# Patient Record
Sex: Male | Born: 1985 | Race: White | Hispanic: No | Marital: Single
Health system: Midwestern US, Community
[De-identification: ages and names within clinical notes are randomized; demographics above are authoritative.]

## PROBLEM LIST (undated history)

## (undated) ENCOUNTER — Emergency Department (HOSPITAL_COMMUNITY): Admission: EM | Discharge status: Absent without leave | Payer: Self-pay

---
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
## 2007-02-07 ENCOUNTER — Emergency Department: Payer: Self-pay | Admitting: Emergency Medicine

## 2007-10-20 ENCOUNTER — Emergency Department: Payer: Self-pay | Admitting: Emergency Medicine

## 2008-04-09 ENCOUNTER — Emergency Department: Payer: Self-pay | Admitting: Emergency Medicine

## 2008-06-09 ENCOUNTER — Emergency Department: Payer: Self-pay | Admitting: Emergency Medicine

## 2014-05-09 ENCOUNTER — Emergency Department
Admit: 2014-05-09 | Disposition: A | Discharge status: Home / Self Care | Payer: Self-pay | Admitting: Emergency Medicine

## 2014-10-12 ENCOUNTER — Encounter: Payer: Self-pay | Admitting: Emergency Medicine

## 2014-10-12 ENCOUNTER — Emergency Department: Payer: Managed Care, Other (non HMO)

## 2014-10-12 ENCOUNTER — Emergency Department
Admission: EM | Admit: 2014-10-12 | Discharge: 2014-10-12 | Disposition: A | Discharge status: Home / Self Care | Payer: Managed Care, Other (non HMO) | Attending: Emergency Medicine | Admitting: Emergency Medicine

## 2014-10-12 DIAGNOSIS — W1842XA Slipping, tripping and stumbling without falling due to stepping into hole or opening, initial encounter: Secondary | ICD-10-CM | POA: Diagnosis not present

## 2014-10-12 DIAGNOSIS — M545 Low back pain, unspecified: Secondary | ICD-10-CM

## 2014-10-12 DIAGNOSIS — S39012A Strain of muscle, fascia and tendon of lower back, initial encounter: Secondary | ICD-10-CM | POA: Diagnosis not present

## 2014-10-12 DIAGNOSIS — S3992XA Unspecified injury of lower back, initial encounter: Secondary | ICD-10-CM | POA: Diagnosis present

## 2014-10-12 DIAGNOSIS — Z720 Tobacco use: Secondary | ICD-10-CM | POA: Diagnosis not present

## 2014-10-12 DIAGNOSIS — Y9289 Other specified places as the place of occurrence of the external cause: Secondary | ICD-10-CM | POA: Insufficient documentation

## 2014-10-12 DIAGNOSIS — Y9389 Activity, other specified: Secondary | ICD-10-CM | POA: Diagnosis not present

## 2014-10-12 DIAGNOSIS — Y998 Other external cause status: Secondary | ICD-10-CM | POA: Diagnosis not present

## 2014-10-12 MED ORDER — ACETAMINOPHEN-CODEINE #3 300-30 MG PO TABS
2.0000 | ORAL_TABLET | Freq: Once | ORAL | Status: AC
  Administered 2014-10-12: 2 via ORAL
  Filled 2014-10-12: qty 2

## 2014-10-12 MED ORDER — ACETAMINOPHEN-CODEINE #3 300-30 MG PO TABS: 1.0000 | ORAL_TABLET | ORAL | Status: DC | PRN

## 2014-10-12 MED ORDER — CYCLOBENZAPRINE HCL 10 MG PO TABS: 10.0000 mg | ORAL_TABLET | Freq: Three times a day (TID) | ORAL | Status: DC | PRN

## 2014-10-12 MED ORDER — CYCLOBENZAPRINE HCL 10 MG PO TABS
10.0000 mg | ORAL_TABLET | Freq: Once | ORAL | Status: AC
  Administered 2014-10-12: 10 mg via ORAL
  Filled 2014-10-12: qty 1

## 2014-10-12 NOTE — ED Provider Notes (Signed)
[facility] Emergency Department Provider Note ____________________________________________  Time seen: Approximately 4:07 PM  I have reviewed the triage vital signs and the nursing notes.   HISTORY  Chief Complaint Back Pain   HPI [name] is a 29 y.o. male resents to the emergency department for evaluation of lower back pain. He states 2 days ago he stepped down into a pothole and sort of jarred his back. Since that time he has had pain in the lower back with radiation and to the right hip. He's had no relief with Goody's powder or Aleve. His job requires heavy lifting and bending. This makes the pain much worse.   History reviewed. No pertinent past medical history.  There are no active problems to display for this patient.   History reviewed. No pertinent past surgical history.  Current Outpatient Rx  Name  Route  Sig  Dispense  Refill  . acetaminophen-codeine (TYLENOL #3) 300-30 MG tablet   Oral   Take 1-2 tablets by mouth every 4 (four) hours as needed for moderate pain.   12 tablet   0   . cyclobenzaprine (FLEXERIL) 10 MG tablet   Oral   Take 1 tablet (10 mg total) by mouth 3 (three) times daily as needed for muscle spasms.   30 tablet   0     Allergies Review of patient's allergies indicates not on file.  No family history on file.  Social History Social History  Substance Use Topics  . Smoking status: Current Every Day Smoker  . Smokeless tobacco: None  . Alcohol Use: None    Review of Systems Constitutional: No recent illness. Eyes: No visual changes. ENT: No sore throat. Cardiovascular: Denies chest pain or palpitations. Respiratory: Denies shortness of breath. Gastrointestinal: No abdominal pain.  Genitourinary: Negative for dysuria. Musculoskeletal: Pain in lower back. Skin: Negative for rash. Neurological: Negative for headaches, focal weakness or numbness. 10-point ROS otherwise  negative.  ____________________________________________   PHYSICAL EXAM:  VITAL SIGNS: ED Triage Vitals  Enc Vitals Group     BP --      Pulse --      Resp --      Temp --      Temp src --      SpO2 --      Weight --      Height --      Head Cir --      Peak Flow --      Pain Score --      Pain Loc --      Pain Edu? --      Excl. in GC? --     Constitutional: Alert and oriented. Well appearing and in no acute distress. Eyes: Conjunctivae are normal. EOMI. Head: Atraumatic. Nose: No congestion/rhinnorhea. Neck: No stridor.  Respiratory: Normal respiratory effort.   Musculoskeletal: Midline tenderness of the lower back upon palpation. Limited range of motion due to pain Neurologic:  Normal speech and language. No gross focal neurologic deficits are appreciated. Speech is normal. No gait instability. Skin:  Skin is warm, dry and intact. Atraumatic. Psychiatric: Mood and affect are normal. Speech and behavior are normal.  ____________________________________________   LABS (all labs ordered are listed, but only abnormal results are displayed)  Labs Reviewed - No data to display ____________________________________________  RADIOLOGY  Lumbar spine x-ray negative for acute bony abnormality. ____________________________________________   PROCEDURES  Procedure(s) performed: None   ____________________________________________   INITIAL IMPRESSION / ASSESSMENT AND PLAN /  ED COURSE  Pertinent labs & imaging results that were available during my care of the patient were reviewed by me and considered in my medical decision making (see chart for details).  Patient was advised to follow-up with the primary care provider if his choice for symptoms that are not relieved by Flexeril and Tylenol 3. He was advised to return to the emergency department for symptoms that change or worsen if he is unable schedule an  appointment. ____________________________________________   FINAL CLINICAL IMPRESSION(S) / ED DIAGNOSES  Final diagnoses:  Acute lumbar back pain  Lumbosacral strain, initial encounter       Chinita Pester, FNP 10/12/14 1753  Phineas Semen, MD 10/12/14 2057

## 2014-10-12 NOTE — ED Notes (Signed)
Pt here with lower back pain and hip pain. Pt states that 2 days ago at work he stepped in a pot hole and his back started hurting. Pt states that the pain has increased since then. Pt has been using OTC meds with no relief. Pt is worse with sitting, pt denies it running down either, pt states that the pain is in the middle of his L-spine and feel tight. Pt denies any problem with bowels. Pt in NAD at this time. Pt was ambulatory to treatment room.

## 2014-11-03 ENCOUNTER — Emergency Department: Payer: Managed Care, Other (non HMO)

## 2014-11-03 ENCOUNTER — Encounter: Payer: Self-pay | Admitting: Emergency Medicine

## 2014-11-03 ENCOUNTER — Emergency Department
Admission: EM | Admit: 2014-11-03 | Discharge: 2014-11-03 | Disposition: A | Discharge status: Home / Self Care | Payer: Managed Care, Other (non HMO) | Attending: Emergency Medicine | Admitting: Emergency Medicine

## 2014-11-03 DIAGNOSIS — S29001A Unspecified injury of muscle and tendon of front wall of thorax, initial encounter: Secondary | ICD-10-CM | POA: Diagnosis not present

## 2014-11-03 DIAGNOSIS — Y998 Other external cause status: Secondary | ICD-10-CM | POA: Diagnosis not present

## 2014-11-03 DIAGNOSIS — Y92410 Unspecified street and highway as the place of occurrence of the external cause: Secondary | ICD-10-CM | POA: Insufficient documentation

## 2014-11-03 DIAGNOSIS — M25572 Pain in left ankle and joints of left foot: Secondary | ICD-10-CM

## 2014-11-03 DIAGNOSIS — R0781 Pleurodynia: Secondary | ICD-10-CM

## 2014-11-03 DIAGNOSIS — S99912A Unspecified injury of left ankle, initial encounter: Secondary | ICD-10-CM | POA: Diagnosis not present

## 2014-11-03 DIAGNOSIS — Y9355 Activity, bike riding: Secondary | ICD-10-CM | POA: Diagnosis not present

## 2014-11-03 DIAGNOSIS — S4992XA Unspecified injury of left shoulder and upper arm, initial encounter: Secondary | ICD-10-CM | POA: Diagnosis present

## 2014-11-03 DIAGNOSIS — M25512 Pain in left shoulder: Secondary | ICD-10-CM

## 2014-11-03 MED ORDER — OXYCODONE-ACETAMINOPHEN 5-325 MG PO TABS
ORAL_TABLET | ORAL | Status: AC
Start: 2014-11-03 — End: 2014-11-03
  Administered 2014-11-03: 1 via ORAL
  Filled 2014-11-03: qty 1

## 2014-11-03 MED ORDER — OXYCODONE-ACETAMINOPHEN 5-325 MG PO TABS: 1.0000 | ORAL_TABLET | ORAL | Status: DC | PRN

## 2014-11-03 MED ORDER — OXYCODONE-ACETAMINOPHEN 5-325 MG PO TABS
1.0000 | ORAL_TABLET | Freq: Once | ORAL | Status: AC
  Administered 2014-11-03: 1 via ORAL

## 2014-11-03 NOTE — Discharge Instructions (Signed)
Please seek medical attention for any high fevers, chest pain, shortness of breath, change in behavior, persistent vomiting, bloody stool or any other new or concerning symptoms.  Avulsion Fracture of the Foot An avulsion fracture of the foot is when a piece of bone in your foot has been torn away. Bones are connected to other bones by strong bands of connective tissue (ligaments). Muscles are also connected to bones with connective tissue (tendons). Avulsion fractures occur when severe stress on a bone from a ligament or tendon causes a small piece of bone to be pulled away.  Athletes may develop an avulsion fracture of the foot gradually (chronic avulsion fracture). The heel bone and the long bone in the foot that connect to the fifth toe (fifth metatarsal bone) are common areas of avulsion fracture of the foot.  CAUSES  An avulsion fracture of the foot can be caused by a sudden or repetitive twisting of your foot or ankle. It can also occur during a fall from a standing height.  RISK FACTORS You may have a higher risk of an avulsion fracture of the foot if you:   Participate in activities during which twisting the ankle or foot are likely, such as:  Dancing.  Track and field.  Walking or hiking on uneven surfaces.  Have had diabetes for many years.  Have osteoporosis. SIGNS AND SYMPTOMS The most common symptom of an avulsion fracture of the foot is intense pain at the time of injury. You may also feel a pop or tearing. The pain continues after the injury. Other signs and symptoms may include:  Swelling.  Bruising.  Pain with movement or weight bearing.  Difficulty walking.  Pain when pressure is applied to the injured area.  Warmth over the injured area. DIAGNOSIS  An avulsion fracture of the foot may be diagnosed by:  History. Your health care provider will ask you what occurred during the time of your injury and whether you had any pain in the area before your  injury.  Physical exam. During the exam, your health care provider may try to move your foot, toes, and ankle to check for pain and level of mobility.  X-ray. This will show if any bones are fractured or out of place.  MRI. This will show your tendons and ligaments. Some avulsion fractures are associated with an injury to a tendon or ligament. TREATMENT  Treatment for an avulsion fracture of the foot depends on the size of the displaced piece of bone and how far it has been pulled out of place. Small avulsion fractures may be treated with rest and support in a cast or brace. Large fragments of bone usually need to be reattached surgically. Treatment of these fractures may also require physical therapy to regain full use of your foot.  Treatments may include:  Rest, ice, compression, and elevations (RICE treatment) as directed by your health care provider.  Medicines that reduce pain and swelling (NSAIDs).  Wearing a splint, elastic wrap, support boot, or cast as directed by your health care provider.  Crutches or a rolling scooter to support your body weight until your foot heals.  Surgery to reattach the bone and tendon or ligament.  Physical therapy. This may last for several months. HOME CARE INSTRUCTIONS  Take medicines only as directed by your health care provider.  Rest your foot until your health care provider says you can resume activity.  Keep your foot raised above the level of your heart when you are  sitting or lying down.  Apply ice to the injured area:  Put ice in a plastic bag.  Place a towel between your skin and the bag.  Leave the ice on for 20 minutes, 2-3 times a day.  Do not allow your cast or splint to get wet as directed by your health care provider.  Keep all follow-up visits as directed by your health care provider. This is important. SEEK MEDICAL CARE IF:  Your pain gets worse.  You have chills or fever.  Your cast or splint is damaged.  The  cast has a bad odor or has stains caused by fluids from the wound. SEEK IMMEDIATE MEDICAL CARE IF:  Your foot is cold, blue, or pale.  You have pain, swelling, redness, or numbness below your cast or splint.   This information is not intended to replace advice given to you by your health care provider. Make sure you discuss any questions you have with your health care provider.   Document Released: 07/28/2013 Document Reviewed: 07/28/2013 Elsevier Interactive Patient Education [employer]2016 Elsevier Inc.

## 2014-11-03 NOTE — ED Notes (Addendum)
Sling applied to left arm. And crutches given to pt per MD order.
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
## 2015-02-13 ENCOUNTER — Emergency Department
Admission: EM | Admit: 2015-02-13 | Discharge: 2015-02-14 | Disposition: A | Discharge status: Home / Self Care | Payer: Managed Care, Other (non HMO) | Attending: Emergency Medicine | Admitting: Emergency Medicine

## 2015-02-13 ENCOUNTER — Encounter: Payer: Self-pay | Admitting: Urgent Care

## 2015-02-13 DIAGNOSIS — H16133 Photokeratitis, bilateral: Secondary | ICD-10-CM | POA: Insufficient documentation

## 2015-02-13 DIAGNOSIS — H578 Other specified disorders of eye and adnexa: Secondary | ICD-10-CM | POA: Diagnosis present

## 2015-02-13 MED ORDER — FLUORESCEIN SODIUM 1 MG OP STRP
1.0000 | ORAL_STRIP | Freq: Once | OPHTHALMIC | Status: AC
  Administered 2015-02-13: 1 via OPHTHALMIC
  Filled 2015-02-13: qty 1

## 2015-02-13 MED ORDER — TETRACAINE HCL 0.5 % OP SOLN
2.0000 [drp] | Freq: Once | OPHTHALMIC | Status: AC
  Administered 2015-02-13: 2 [drp] via OPHTHALMIC

## 2015-02-13 MED ORDER — TETRACAINE HCL 0.5 % OP SOLN
OPHTHALMIC | Status: AC
  Administered 2015-02-13: 2 [drp] via OPHTHALMIC
  Filled 2015-02-13: qty 2

## 2015-02-13 MED ORDER — ERYTHROMYCIN 5 MG/GM OP OINT: 1.0000 "application " | TOPICAL_OINTMENT | Freq: Four times a day (QID) | OPHTHALMIC | Status: DC

## 2015-02-13 MED ORDER — DIAZEPAM 5 MG PO TABS
5.0000 mg | ORAL_TABLET | Freq: Once | ORAL | Status: AC
  Administered 2015-02-13: 5 mg via ORAL
  Filled 2015-02-13: qty 1

## 2015-02-13 MED ORDER — ERYTHROMYCIN 5 MG/GM OP OINT
1.0000 "application " | TOPICAL_OINTMENT | Freq: Four times a day (QID) | OPHTHALMIC | Status: DC
  Administered 2015-02-14: 1 via OPHTHALMIC
  Filled 2015-02-13: qty 1

## 2015-02-13 MED ORDER — IBUPROFEN 800 MG PO TABS
800.0000 mg | ORAL_TABLET | Freq: Once | ORAL | Status: AC
  Administered 2015-02-13: 800 mg via ORAL
  Filled 2015-02-13: qty 1

## 2015-02-13 NOTE — Discharge Instructions (Signed)
How to Use Eye Drops and Eye Ointments HOW TO APPLY EYE DROPS Follow these steps when applying eye drops: 1. Wash your hands. 2. Tilt your head back. 3. Put a finger under your eye and use it to gently pull your lower lid downward. Keep that finger in place. 4. Using your other hand, hold the dropper between your thumb and index finger. 5. Position the dropper just over the edge of the lower lid. Hold it as close to your eye as you can without touching the dropper to your eye. 6. Steady your hand. One way to do this is to lean your index finger against your brow. 7. Look up. 8. Slowly and gently squeeze one drop of medicine into your eye. 9. Close your eye. 10. Place a finger between your lower eyelid and your nose. Press gently for 2 minutes. This increases the amount of time that the medicine is exposed to the eye. It also reduces side effects that can develop if the drop gets into the bloodstream through the nose. HOW TO APPLY EYE OINTMENTS Follow these steps when applying eye ointments: 1. Wash your hands. 2. Put a finger under your eye and use it to gently pull your lower lid downward. Keep that finger in place. 3. Using your other hand, place the tip of the tube between your thumb and index finger with the remaining fingers braced against your cheek or nose. 4. Hold the tube just over the edge of your lower lid without touching the tube to your lid or eyeball. 5. Look up. 6. Line the inner part of your lower lid with ointment. 7. Gently pull up on your upper lid and look down. This will force the ointment to spread over the surface of the eye. 8. Release the upper lid. 9. If you can, close your eyes for 1-2 minutes. Do not rub your eyes. If you applied the ointment correctly, your vision will be blurry for a few minutes. This is normal. ADDITIONAL INFORMATION  Make sure to use the eye drops or ointment as told by your health care provider.  If you have been told to use both eye  drops and an eye ointment, apply the eye drops first, then wait 3-4 minutes before you apply the ointment.  Try not to touch the tip of the dropper or tube to your eye. A dropper or tube that has touched the eye can become contaminated.   This information is not intended to replace advice given to you by your health care provider. Make sure you discuss any questions you have with your health care provider.   Document Released: 04/08/2000 Document Revised: 05/17/2014 Document Reviewed: 12/27/2013 Elsevier Interactive Patient Education 2016 [employer].   Use the eye drops as directed. Apply antibiotic ointment or sunburn cream to the face to soothe skin. Follow-up with [facility] tomorrow for recheck as discussed. Wear sunglasses to protect from excessive sun exposure. Wear safety glasses when working.

## 2015-02-13 NOTE — ED Notes (Addendum)
Patient presents with c/o photokeratitis symptoms. Patient screaming in triage. Started a new job Engineer, structural. Pain started after patient got home.
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
## 2015-02-14 MED ORDER — TRAMADOL HCL 50 MG PO TABS: 50.0000 mg | ORAL_TABLET | Freq: Two times a day (BID) | ORAL | Status: DC

## 2015-02-14 NOTE — ED Notes (Signed)

## 2015-05-01 ENCOUNTER — Encounter: Payer: Self-pay | Admitting: Emergency Medicine

## 2015-05-01 ENCOUNTER — Emergency Department
Admission: EM | Admit: 2015-05-01 | Discharge: 2015-05-01 | Disposition: A | Discharge status: Home / Self Care | Payer: No Typology Code available for payment source | Attending: Emergency Medicine | Admitting: Emergency Medicine

## 2015-05-01 DIAGNOSIS — W260XXA Contact with knife, initial encounter: Secondary | ICD-10-CM | POA: Insufficient documentation

## 2015-05-01 DIAGNOSIS — Y929 Unspecified place or not applicable: Secondary | ICD-10-CM | POA: Insufficient documentation

## 2015-05-01 DIAGNOSIS — S51811A Laceration without foreign body of right forearm, initial encounter: Secondary | ICD-10-CM

## 2015-05-01 DIAGNOSIS — Y9389 Activity, other specified: Secondary | ICD-10-CM | POA: Insufficient documentation

## 2015-05-01 DIAGNOSIS — Y999 Unspecified external cause status: Secondary | ICD-10-CM | POA: Insufficient documentation

## 2015-05-01 MED ORDER — LIDOCAINE-EPINEPHRINE (PF) 1 %-1:200000 IJ SOLN
10.0000 mL | Freq: Once | INTRAMUSCULAR | Status: DC
  Filled 2015-05-01 (×2): qty 30

## 2015-05-01 NOTE — Discharge Instructions (Signed)
Laceration Care, Adult °A laceration is a cut that goes through all of the layers of the skin and into the tissue that is right under the skin. Some lacerations heal on their own. Others need to be closed with stitches (sutures), staples, skin adhesive strips, or skin glue. Proper laceration care minimizes the risk of infection and helps the laceration to heal better. °HOW TO CARE FOR YOUR LACERATION °If sutures or staples were used: °· Keep the wound clean and dry. °· If you were given a bandage (dressing), you should change it at least one time per day or as told by your health care provider. You should also change it if it becomes wet or dirty. °· Keep the wound completely dry for the first 24 hours or as told by your health care provider. After that time, you may shower or bathe. However, make sure that the wound is not soaked in water until after the sutures or staples have been removed. °· Clean the wound one time each day or as told by your health care provider: °· Wash the wound with soap and water. °· Rinse the wound with water to remove all soap. °· Pat the wound dry with a clean towel. Do not rub the wound. °· After cleaning the wound, apply a thin layer of antibiotic ointment as told by your health care provider. This will help to prevent infection and keep the dressing from sticking to the wound. °· Have the sutures or staples removed as told by your health care provider. °If skin adhesive strips were used: °· Keep the wound clean and dry. °· If you were given a bandage (dressing), you should change it at least one time per day or as told by your health care provider. You should also change it if it becomes dirty or wet. °· Do not get the skin adhesive strips wet. You may shower or bathe, but be careful to keep the wound dry. °· If the wound gets wet, pat it dry with a clean towel. Do not rub the wound. °· Skin adhesive strips fall off on their own. You may trim the strips as the wound heals. Do not  remove skin adhesive strips that are still stuck to the wound. They will fall off in time. °If skin glue was used: °· Try to keep the wound dry, but you may briefly wet it in the shower or bath. Do not soak the wound in water, such as by swimming. °· After you have showered or bathed, gently pat the wound dry with a clean towel. Do not rub the wound. °· Do not do any activities that will make you sweat heavily until the skin glue has fallen off on its own. °· Do not apply liquid, cream, or ointment medicine to the wound while the skin glue is in place. Using those may loosen the film before the wound has healed. °· If you were given a bandage (dressing), you should change it at least one time per day or as told by your health care provider. You should also change it if it becomes dirty or wet. °· If a dressing is placed over the wound, be careful not to apply tape directly over the skin glue. Doing that may cause the glue to be pulled off before the wound has healed. °· Do not pick at the glue. The skin glue usually remains in place for 5-10 days, then it falls off of the skin. °General Instructions °· Take over-the-counter and prescription   medicines only as told by your health care provider. °· If you were prescribed an antibiotic medicine or ointment, take or apply it as told by your doctor. Do not stop using it even if your condition improves. °· To help prevent scarring, make sure to cover your wound with sunscreen whenever you are outside after stitches are removed, after adhesive strips are removed, or when glue remains in place and the wound is healed. Make sure to wear a sunscreen of at least 30 SPF. °· Do not scratch or pick at the wound. °· Keep all follow-up visits as told by your health care provider. This is important. °· Check your wound every day for signs of infection. Watch for: °· Redness, swelling, or pain. °· Fluid, blood, or pus. °· Raise (elevate) the injured area above the level of your heart  while you are sitting or lying down, if possible. °SEEK MEDICAL CARE IF: °· You received a tetanus shot and you have swelling, severe pain, redness, or bleeding at the injection site. °· You have a fever. °· A wound that was closed breaks open. °· You notice a bad smell coming from your wound or your dressing. °· You notice something coming out of the wound, such as wood or glass. °· Your pain is not controlled with medicine. °· You have increased redness, swelling, or pain at the site of your wound. °· You have fluid, blood, or pus coming from your wound. °· You notice a change in the color of your skin near your wound. °· You need to change the dressing frequently due to fluid, blood, or pus draining from the wound. °· You develop a new rash. °· You develop numbness around the wound. °SEEK IMMEDIATE MEDICAL CARE IF: °· You develop severe swelling around the wound. °· Your pain suddenly increases and is severe. °· You develop painful lumps near the wound or on skin that is anywhere on your body. °· You have a red streak going away from your wound. °· The wound is on your hand or foot and you cannot properly move a finger or toe. °· The wound is on your hand or foot and you notice that your fingers or toes look pale or bluish. °  °This information is not intended to replace advice given to you by your health care provider. Make sure you discuss any questions you have with your health care provider. °  °Document Released: 12/31/2004 Document Revised: 05/17/2014 Document Reviewed: 12/27/2013 °Elsevier Interactive Patient Education ©2016 [employer] ° °Stitches, Staples, or Adhesive Wound Closure °Health care providers use stitches (sutures), staples, and certain glue (skin adhesives) to hold skin together while it heals (wound closure). You may need this treatment after you have surgery or if you cut your skin accidentally. These methods help your skin to heal more quickly and make it less likely that you will have  a scar. A wound may take several months to heal completely. °The type of wound you have determines when your wound gets closed. In most cases, the wound is closed as soon as possible (primary skin closure). Sometimes, closure is delayed so the wound can be cleaned and allowed to heal naturally. This reduces the chance of infection. Delayed closure may be needed if your wound: °· Is caused by a bite. °· Happened more than 6 hours ago. °· Involves loss of skin or the tissues under the skin. °· Has dirt or debris in it that cannot be removed. °· Is infected. °WHAT   ARE THE DIFFERENT KINDS OF WOUND CLOSURES? °There are many options for wound closure. The one that your health care provider uses depends on how deep and how large your wound is. °Adhesive Glue °To use this type of glue to close a wound, your health care provider holds the edges of the wound together and paints the glue on the surface of your skin. You may need more than one layer of glue. Then the wound may be covered with a light bandage (dressing). °This type of skin closure may be used for small wounds that are not deep (superficial). Using glue for wound closure is less painful than other methods. It does not require a medicine that numbs the area (local anesthetic). This method also leaves nothing to be removed. Adhesive glue is often used for children and on facial wounds. °Adhesive glue cannot be used for wounds that are deep, uneven, or bleeding. It is not used inside of a wound.  °Adhesive Strips °These strips are made of sticky (adhesive), porous paper. They are applied across your skin edges like a regular adhesive bandage. You leave them on until they fall off. °Adhesive strips may be used to close very superficial wounds. They may also be used along with sutures to improve the closure of your skin edges.  °Sutures °Sutures are the oldest method of wound closure. Sutures can be made from natural substances, such as silk, or from synthetic  materials, such as nylon and steel. They can be made from a material that your body can break down as your wound heals (absorbable), or they can be made from a material that needs to be removed from your skin (nonabsorbable). They come in many different strengths and sizes. °Your health care provider attaches the sutures to a steel needle on one end. Sutures can be passed through your skin, or through the tissues beneath your skin. Then they are tied and cut. Your skin edges may be closed in one continuous stitch or in separate stitches. °Sutures are strong and can be used for all kinds of wounds. Absorbable sutures may be used to close tissues under the skin. The disadvantage of sutures is that they may cause skin reactions that lead to infection. Nonabsorbable sutures need to be removed. °Staples °When surgical staples are used to close a wound, the edges of your skin on both sides of the wound are brought close together. A staple is placed across the wound, and an instrument secures the edges together. Staples are often used to close surgical cuts (incisions). °Staples are faster to use than sutures, and they cause less skin reaction. Staples need to be removed using a tool that bends the staples away from your skin. °HOW DO I CARE FOR MY WOUND CLOSURE? °· Take medicines only as directed by your health care provider. °· If you were prescribed an antibiotic medicine for your wound, finish it all even if you start to feel better. °· Use ointments or creams only as directed by your health care provider. °· Wash your hands with soap and water before and after touching your wound. °· Do not soak your wound in water. Do not take baths, swim, or use a hot tub until your health care provider approves. °· Ask your health care provider when you can start showering. Cover your wound if directed by your health care provider. °· Do not take out your own sutures or staples. °· Do not pick at your wound. Picking can cause an  infection. °·   Keep all follow-up visits as directed by your health care provider. This is important. °HOW LONG WILL I HAVE MY WOUND CLOSURE? °· Leave adhesive glue on your skin until the glue peels away. °· Leave adhesive strips on your skin until the strips fall off. °· Absorbable sutures will dissolve within several days. °· Nonabsorbable sutures and staples must be removed. The location of the wound will determine how long they stay in. This can range from several days to a couple of weeks. °WHEN SHOULD I SEEK HELP FOR MY WOUND CLOSURE? °Contact your health care provider if: °· You have a fever. °· You have chills. °· You have drainage, redness, swelling, or pain at your wound. °· There is a bad smell coming from your wound. °· The skin edges of your wound start to separate after your sutures have been removed. °· Your wound becomes thick, raised, and darker in color after your sutures come out (scarring). °  °This information is not intended to replace advice given to you by your health care provider. Make sure you discuss any questions you have with your health care provider. °  °Document Released: 09/25/2000 Document Revised: 01/21/2014 Document Reviewed: 06/09/2013 °Elsevier Interactive Patient Education ©2016 [employer] ° °

## 2015-05-01 NOTE — ED Notes (Signed)
Reports cut right arm with knife while trying to cut a ziptie.  Bleeding controlled.

## 2015-05-01 NOTE — ED Provider Notes (Signed)
[facility] Emergency Department Provider Note  ____________________________________________  Time seen: Approximately 1:26 PM  I have reviewed the triage vital signs and the nursing notes.   HISTORY  Chief Complaint Laceration    HPI [name] is a 30 y.o. male , NAD, presents to the emergency department with 1 day history of laceration to the right forearm. States he was cutting a zip tie with a knife, and cut his right arm last night. Has had some continued bleeding since that time. Tried to use butterfly bandage to close the wound but was unsuccessful. Denies numbness, weakness, tingling. Tetanus is UTD and last was given ~2 years ago.    History reviewed. No pertinent past medical history.  There are no active problems to display for this patient.   History reviewed. No pertinent past surgical history.  Current Outpatient Rx  Name  Route  Sig  Dispense  Refill  . acetaminophen-codeine (TYLENOL #3) 300-30 MG tablet   Oral   Take 1-2 tablets by mouth every 4 (four) hours as needed for moderate pain.   12 tablet   0   . cyclobenzaprine (FLEXERIL) 10 MG tablet   Oral   Take 1 tablet (10 mg total) by mouth 3 (three) times daily as needed for muscle spasms.   30 tablet   0   . erythromycin ophthalmic ointment   Both Eyes   Place 1 application into both eyes 4 (four) times daily.   3.5 g   0   . oxyCODONE-acetaminophen (ROXICET) 5-325 MG tablet   Oral   Take 1 tablet by mouth every 4 (four) hours as needed for severe pain.   15 tablet   0   . traMADol (ULTRAM) 50 MG tablet   Oral   Take 1 tablet (50 mg total) by mouth 2 (two) times daily.   10 tablet   0     Allergies Review of patient's allergies indicates no known allergies.  No family history on file.  Social History Social History  Substance Use Topics  . Smoking status: Never Smoker   . Smokeless tobacco: None  . Alcohol Use: No     Review of Systems   Constitutional: No fever/chills, fatigue.  Cardiovascular: No chest pain. Respiratory:  No shortness of breath.  Musculoskeletal: Negative for right arm, elbow, wrist pain.  Skin: Positive laceration to right forearm. Negative for rash, bruising. Neurological: Negative for headaches, focal weakness or numbness. No tingling.  10-point ROS otherwise negative.  ____________________________________________   PHYSICAL EXAM:  VITAL SIGNS: ED Triage Vitals  Enc Vitals Group     BP 05/01/15 1303 133/79 mmHg     Pulse Rate 05/01/15 1303 97     Resp 05/01/15 1303 18     Temp 05/01/15 1303 98.3 F (36.8 C)     Temp Source 05/01/15 1303 Oral     SpO2 05/01/15 1303 96 %     Weight 05/01/15 1303 185 lb (83.915 kg)     Height 05/01/15 1303  (1.854 m)     Head Cir --      Peak Flow --      Pain Score --      Pain Loc --      Pain Edu? --      Excl. in GC? --      Constitutional: Alert and oriented. Well appearing and in no acute distress. Eyes: Conjunctivae are normal.  Head: Atraumatic. Cardiovascular:  Good peripheral circulation with 2+  pulses noted in the right upper extremity. Respiratory: Normal respiratory effort without tachypnea or retractions. Musculoskeletal: No bony tenderness to palpation of the right elbow, forearm, wrist. FROM of right upper extremity. Neurologic:  Normal speech and language. No gross focal neurologic deficits are appreciated.  Skin:  4cm x 1 cm laceration to right forearm. Bleeding controlled. Skin is warm, dryt. No rash noted. Psychiatric: Mood and affect are normal. Speech and behavior are normal. Patient exhibits appropriate insight and judgement.   ____________________________________________   LABS  None  ____________________________________________  EKG  None ____________________________________________  RADIOLOGY  None ____________________________________________    PROCEDURES  Procedure(s) performed: LACERATION  REPAIR Performed by: Hope PigeonJami L Hagler Authorized by: Hope PigeonJami L Hagler Consent: Verbal consent obtained. Risks and benefits: risks, benefits and alternatives were discussed Consent given by: patient Patient identity confirmed: provided demographic data Prepped and Draped in normal sterile fashion Wound explored  Laceration Location: right forearm  Laceration Length: 4cm  No Foreign Bodies seen or palpated  Anesthesia: local infiltration  Local anesthetic: lidocaine 1% with epinephrine  Anesthetic total: 1.5 ml  Irrigation method: syringe Amount of cleaning: standard  Skin closure: 4-0 ethilon  Number of sutures: 7  Technique: simple, interrupted  Patient tolerance: Patient tolerated the procedure well with no immediate complications.      Medications  lidocaine-EPINEPHrine (XYLOCAINE-EPINEPHrine) 1 %-1:200000 (PF) injection 10 mL (not administered)     ____________________________________________   INITIAL IMPRESSION / ASSESSMENT AND PLAN / ED COURSE  Patient's diagnosis is consistent with laceration of right forearm without complication. Patient will be discharged home with instruction for wound care. Patient is to [facility] in 7 days for suture removal or sooner if any evidence of increasing pain, onset of redness or oozing/weeping. Patient is given ED precautions to return to the ED for any worsening or new symptoms.    ____________________________________________  FINAL CLINICAL IMPRESSION(S) / ED DIAGNOSES  Final diagnoses:  Laceration of right forearm without complication, initial encounter      NEW MEDICATIONS STARTED DURING THIS VISIT:  Discharge Medication List as of 05/01/2015  2:43 PM           Hope PigeonJami L Hagler, PA-C 05/01/15 1537  Jene Everyobert Kinner, MD 05/05/15 0030

## 2015-09-15 ENCOUNTER — Emergency Department: Payer: Self-pay

## 2015-09-15 ENCOUNTER — Encounter: Payer: Self-pay | Admitting: Emergency Medicine

## 2015-09-15 ENCOUNTER — Emergency Department
Admission: EM | Admit: 2015-09-15 | Discharge: 2015-09-15 | Disposition: A | Discharge status: Home / Self Care | Payer: Self-pay | Attending: Emergency Medicine | Admitting: Emergency Medicine

## 2015-09-15 DIAGNOSIS — Z23 Encounter for immunization: Secondary | ICD-10-CM | POA: Insufficient documentation

## 2015-09-15 DIAGNOSIS — Y929 Unspecified place or not applicable: Secondary | ICD-10-CM | POA: Insufficient documentation

## 2015-09-15 DIAGNOSIS — Z283 Underimmunization status: Secondary | ICD-10-CM

## 2015-09-15 DIAGNOSIS — Z79899 Other long term (current) drug therapy: Secondary | ICD-10-CM | POA: Insufficient documentation

## 2015-09-15 DIAGNOSIS — Z2839 Other underimmunization status: Secondary | ICD-10-CM

## 2015-09-15 DIAGNOSIS — W450XXA Nail entering through skin, initial encounter: Secondary | ICD-10-CM | POA: Insufficient documentation

## 2015-09-15 DIAGNOSIS — Y939 Activity, unspecified: Secondary | ICD-10-CM | POA: Insufficient documentation

## 2015-09-15 DIAGNOSIS — S91331A Puncture wound without foreign body, right foot, initial encounter: Secondary | ICD-10-CM | POA: Insufficient documentation

## 2015-09-15 DIAGNOSIS — Y999 Unspecified external cause status: Secondary | ICD-10-CM | POA: Insufficient documentation

## 2015-09-15 DIAGNOSIS — Z792 Long term (current) use of antibiotics: Secondary | ICD-10-CM | POA: Insufficient documentation

## 2015-09-15 MED ORDER — CLINDAMYCIN PHOSPHATE 900 MG/6ML IJ SOLN
600.0000 mg | Freq: Once | INTRAMUSCULAR | Status: AC
  Administered 2015-09-15: 600 mg via INTRAMUSCULAR

## 2015-09-15 MED ORDER — CLINDAMYCIN PHOSPHATE 300 MG/2ML IJ SOLN
INTRAMUSCULAR | Status: AC
  Filled 2015-09-15: qty 2

## 2015-09-15 MED ORDER — IBUPROFEN 600 MG PO TABS: 600.0000 mg | ORAL_TABLET | Freq: Three times a day (TID) | ORAL | 0 refills | Status: DC | PRN

## 2015-09-15 MED ORDER — HYDROCODONE-ACETAMINOPHEN 5-325 MG PO TABS: 1.0000 | ORAL_TABLET | ORAL | 0 refills | Status: DC | PRN

## 2015-09-15 MED ORDER — CLINDAMYCIN HCL 150 MG PO CAPS: ORAL_CAPSULE | ORAL | 0 refills | Status: DC

## 2015-09-15 MED ORDER — TETANUS-DIPHTH-ACELL PERTUSSIS 5-2.5-18.5 LF-MCG/0.5 IM SUSP
0.5000 mL | Freq: Once | INTRAMUSCULAR | Status: AC
  Administered 2015-09-15: 0.5 mL via INTRAMUSCULAR
  Filled 2015-09-15: qty 0.5

## 2015-09-15 NOTE — ED Provider Notes (Signed)
[facility] Emergency Department Provider Note    ___________________________________________   First MD Initiated Contact with Patient 09/15/15 1338     (approximate)  I have reviewed the triage vital signs and the nursing notes.   HISTORY  Chief Complaint Puncture Wound    HPI [name] is a 30 y.o. male is here with complaint of right foot pain. Patient was wearing flip-flops and stepped on nail approximately one week ago and gradually has had increased pain and swelling. Patient initially cleaned the area with peroxide, iodine, soaked in warm water, and taking ibuprofen. Patient is unsure of his last tetanus immunization. He has not seen any drainage from the area but states that today it slightly more swollen and red. He is unaware of any fever and denies chills. Patient rates his pain as a 6/10.   History reviewed. No pertinent past medical history.  There are no active problems to display for this patient.   History reviewed. No pertinent surgical history.  Prior to Admission medications   Medication Sig Start Date End Date Taking? Authorizing Provider  acetaminophen-codeine (TYLENOL #3) 300-30 MG tablet Take 1-2 tablets by mouth every 4 (four) hours as needed for moderate pain. 10/12/14   Chinita Pesterari B Triplett, FNP  clindamycin (CLEOCIN) 150 MG capsule Begin taking medication on 09/16/15 2 capsules 4 times a day for 7 days. 09/15/15   [name], PA-C  cyclobenzaprine (FLEXERIL) 10 MG tablet Take 1 tablet (10 mg total) by mouth 3 (three) times daily as needed for muscle spasms. 10/12/14   Chinita Pesterari B Triplett, FNP  erythromycin ophthalmic ointment Place 1 application into both eyes 4 (four) times daily. 02/13/15   Jenise V Bacon Menshew, PA-C  HYDROcodone-acetaminophen (NORCO/VICODIN) 5-325 MG tablet Take 1-2 tablets by mouth every 4 (four) hours as needed for moderate pain. 09/15/15   [name], PA-C  ibuprofen (ADVIL,MOTRIN) 600 MG tablet  Take 1 tablet (600 mg total) by mouth every 8 (eight) hours as needed. 09/15/15   [name], PA-C  oxyCODONE-acetaminophen (ROXICET) 5-325 MG tablet Take 1 tablet by mouth every 4 (four) hours as needed for severe pain. 11/03/14   [name], MD  traMADol (ULTRAM) 50 MG tablet Take 1 tablet (50 mg total) by mouth 2 (two) times daily. 02/14/15   Jenise V Bacon Menshew, PA-C    Allergies Review of patient's allergies indicates no known allergies.  No family history on file.  Social History Social History  Substance Use Topics  . Smoking status: Never Smoker  . Smokeless tobacco: Never Used  . Alcohol use No    Review of Systems Constitutional: Denies fever/chills Eyes: No visual changes. Cardiovascular: Denies chest pain. Respiratory: Denies shortness of breath. Gastrointestinal:   No nausea, no vomiting.  Musculoskeletal: Negative for back pain. Skin: Positive for puncture wound. Positive for erythema Neurological: Negative for headaches, focal weakness or numbness.  10-point ROS otherwise negative.  ____________________________________________   PHYSICAL EXAM:  VITAL SIGNS: ED Triage Vitals [09/15/15 1240]  Enc Vitals Group     BP (!) 151/83     Pulse      Resp 16     Temp 98.3 F (36.8 C)     Temp Source Oral     SpO2 100 %     Weight 190 lb (86.2 kg)     Height 6\' 1"  (1.854 m)     Head Circumference      Peak Flow  Pain Score 6     Pain Loc      Pain Edu?      Excl. in GC?     Constitutional: Alert and oriented. Well appearing and in no acute distress. Eyes: Conjunctivae are normal. PERRL. EOMI. Head: Atraumatic. Nose: No congestion/rhinnorhea. Neck: No stridor.   Cardiovascular: Normal rate, regular rhythm. Grossly normal heart sounds.  Good peripheral circulation. Respiratory: Normal respiratory effort.  No retractions. Lungs CTAB. Musculoskeletal: On examination of the right foot plantar aspect distal portion in the vicinity of the third  and fourth distal middle tarsal there is a single puncture wound with tenderness but without drainage. There is erythema and tenderness on the dorsal aspect one third the way up his right foot. Patient is motor sensory function intact. Capillary refill is less than 3 seconds. Neurologic:  Normal speech and language. No gross focal neurologic deficits are appreciated.  Skin:  Skin is warm, dry and intact. No rash noted. Psychiatric: Mood and affect are normal. Speech and behavior are normal.  ____________________________________________   LABS (all labs ordered are listed, but only abnormal results are displayed)  Labs Reviewed - No data to display  RADIOLOGY  Right foot x-ray per radiologist no fracture, dislocation or foreign body noted. I, Tommi Rumps, personally viewed and evaluated these images (plain radiographs) as part of my medical decision making, as well as reviewing the written report by the radiologist. ____________________________________________   PROCEDURES  Procedure(s) performed: None  Procedures  Critical Care performed: No  ____________________________________________   INITIAL IMPRESSION / ASSESSMENT AND PLAN / ED COURSE  Pertinent labs & imaging results that were available during my care of the patient were reviewed by me and considered in my medical decision making (see chart for details).    Clinical Course   Patient was given clindamycin IM while in the emergency room along with prescription. He is also given a prescription for ibuprofen and Norco for pain. He is to return to the emergency room if any severe worsening of his foot, fever, nausea or vomiting. Patient was also encouraged to remain off work 2 days and use warm water soaks to his foot.  ____________________________________________   FINAL CLINICAL IMPRESSION(S) / ED DIAGNOSES  Final diagnoses:  Puncture wound of right foot, initial encounter  Not up to date with tetanus toxoid  immunization      NEW MEDICATIONS STARTED DURING THIS VISIT:  New Prescriptions   CLINDAMYCIN (CLEOCIN) 150 MG CAPSULE    Begin taking medication on 09/16/15 2 capsules 4 times a day for 7 days.   HYDROCODONE-ACETAMINOPHEN (NORCO/VICODIN) 5-325 MG TABLET    Take 1-2 tablets by mouth every 4 (four) hours as needed for moderate pain.   IBUPROFEN (ADVIL,MOTRIN) 600 MG TABLET    Take 1 tablet (600 mg total) by mouth every 8 (eight) hours as needed.     Note:  This document was prepared using Dragon voice recognition software and may include unintentional dictation errors.    Tommi Rumps, PA-C 09/15/15 1449    Emily Filbert, MD 09/15/15 [phone]

## 2015-09-15 NOTE — ED Triage Notes (Signed)
Pt reports stepping on a nail x1 week ago, reports swelling and painful to walk. Puncture wound to right foot, by smallest toe. Unsure of last tetanus shot.

## 2015-09-15 NOTE — Discharge Instructions (Signed)
Soak your foot in warm water 4-5 times per day. Begin taking antibiotics starting tomorrow. Elevate foot often as needed for swelling. Ibuprofen and Norco as needed for pain and inflammation. Return to the emergency room this weekend if any worsening of your  symptoms.

## 2016-08-07 ENCOUNTER — Emergency Department
Admission: EM | Admit: 2016-08-07 | Discharge: 2016-08-07 | Disposition: A | Discharge status: Home / Self Care | Payer: Managed Care, Other (non HMO) | Attending: Emergency Medicine | Admitting: Emergency Medicine

## 2016-08-07 DIAGNOSIS — L03211 Cellulitis of face: Secondary | ICD-10-CM | POA: Insufficient documentation

## 2016-08-07 LAB — CBC WITH DIFFERENTIAL/PLATELET
Basophils Absolute: 0.1 10*3/uL (ref 0–0.1)
Basophils Relative: 0 %
Eosinophils Absolute: 0.3 10*3/uL (ref 0–0.7)
Eosinophils Relative: 3 %
HCT: 41.2 % (ref 40.0–52.0)
Hemoglobin: 14 g/dL (ref 13.0–18.0)
Lymphocytes Relative: 26 %
Lymphs Abs: 3.2 10*3/uL (ref 1.0–3.6)
MCH: 29.5 pg (ref 26.0–34.0)
MCHC: 33.9 g/dL (ref 32.0–36.0)
MCV: 86.9 fL (ref 80.0–100.0)
Monocytes Absolute: 1.1 10*3/uL — ABNORMAL HIGH (ref 0.2–1.0)
Monocytes Relative: 9 %
Neutro Abs: 7.5 10*3/uL — ABNORMAL HIGH (ref 1.4–6.5)
Neutrophils Relative %: 62 %
Platelets: 242 10*3/uL (ref 150–440)
RBC: 4.74 MIL/uL (ref 4.40–5.90)
RDW: 13.8 % (ref 11.5–14.5)
WBC: 12.1 10*3/uL — ABNORMAL HIGH (ref 3.8–10.6)

## 2016-08-07 LAB — COMPREHENSIVE METABOLIC PANEL
ALT: 19 U/L (ref 17–63)
AST: 24 U/L (ref 15–41)
Albumin: 4.5 g/dL (ref 3.5–5.0)
Alkaline Phosphatase: 27 U/L — ABNORMAL LOW (ref 38–126)
Anion gap: 8 (ref 5–15)
BUN: 20 mg/dL (ref 6–20)
CO2: 30 mmol/L (ref 22–32)
Calcium: 9.6 mg/dL (ref 8.9–10.3)
Chloride: 102 mmol/L (ref 101–111)
Creatinine, Ser: 1.14 mg/dL (ref 0.61–1.24)
GFR calc Af Amer: >60 mL/min (ref >60)
GFR calc non Af Amer: >60 mL/min (ref >60)
Glucose, Bld: 90 mg/dL (ref 65–99)
Potassium: 4.2 mmol/L (ref 3.5–5.1)
Sodium: 140 mmol/L (ref 135–145)
Total Bilirubin: 0.9 mg/dL (ref 0.3–1.2)
Total Protein: 7.7 g/dL (ref 6.5–8.1)

## 2016-08-07 MED ORDER — MELOXICAM 15 MG PO TABS: 15.0000 mg | ORAL_TABLET | Freq: Every day | ORAL | 0 refills | Status: DC

## 2016-08-07 MED ORDER — SULFAMETHOXAZOLE-TRIMETHOPRIM 800-160 MG PO TABS: 1.0000 | ORAL_TABLET | Freq: Two times a day (BID) | ORAL | 0 refills | Status: DC

## 2016-08-07 NOTE — ED Notes (Signed)

## 2016-08-07 NOTE — ED Triage Notes (Signed)
Pt reports he felt like his face was burning last night, rinsed with water. Pt reports left sided facial swelling around 5am today, took 6 benadryl with no improvement. Pt reports then swelling to right side of face around noon. Pt with sores to forehead and bilateral cheeks. Denies chemical exposure. Airway intact, even and nonlabored respirations noted.

## 2016-08-07 NOTE — ED Provider Notes (Signed)
[facility] Emergency Department Provider Note  ____________________________________________  Time seen: Approximately 6:04 PM  I have reviewed the triage vital signs and the nursing notes.   HISTORY  Chief Complaint Facial Swelling    HPI [name] is a 31 y.o. male who presents to emergency department complaining of a rash/lesions to bilateral face and forehead. Patient reports that last night he was symptom-free and woke up at 3 AM with swelling, burning sensation, visible lesions. Patient reports areas 7 using a yellowish clear fluid but no frank pus. He reports swelling is greater on the left cheek versus right. Patient reports initially he was scratching at same until the lesions worsen. Patient denies any fevers or chills, angioedema, difficulty breathing or swallowing. Patient reports that he has had no new foods or medications. Patient does report that he is a routine donor for blood and plasma. He is unsure if any blood levels has had no recent blood work. He denies any contact with poison oak or IV. No other members of his family or friends have similar symptoms.   No past medical history on file.  There are no active problems to display for this patient.   No past surgical history on file.  Prior to Admission medications   Medication Sig Start Date End Date Taking? Authorizing Provider  acetaminophen-codeine (TYLENOL #3) 300-30 MG tablet Take 1-2 tablets by mouth every 4 (four) hours as needed for moderate pain. 10/12/14   Triplett, Rulon Eisenmengerari B, FNP  clindamycin (CLEOCIN) 150 MG capsule Begin taking medication on 09/16/15 2 capsules 4 times a day for 7 days. 09/15/15   Tommi RumpsSummers, Rhonda L, PA-C  cyclobenzaprine (FLEXERIL) 10 MG tablet Take 1 tablet (10 mg total) by mouth 3 (three) times daily as needed for muscle spasms. 10/12/14   Triplett, Rulon Eisenmengerari B, FNP  erythromycin ophthalmic ointment Place 1 application into both eyes 4 (four) times daily. 02/13/15    Menshew, Charlesetta IvoryJenise V Bacon, PA-C  HYDROcodone-acetaminophen (NORCO/VICODIN) 5-325 MG tablet Take 1-2 tablets by mouth every 4 (four) hours as needed for moderate pain. 09/15/15   Tommi RumpsSummers, Rhonda L, PA-C  ibuprofen (ADVIL,MOTRIN) 600 MG tablet Take 1 tablet (600 mg total) by mouth every 8 (eight) hours as needed. 09/15/15   Tommi RumpsSummers, Rhonda L, PA-C  meloxicam (MOBIC) 15 MG tablet Take 1 tablet (15 mg total) by mouth daily. 08/07/16   Shantice Menger, Delorise RoyalsJonathan D, PA-C  oxyCODONE-acetaminophen (ROXICET) 5-325 MG tablet Take 1 tablet by mouth every 4 (four) hours as needed for severe pain. 11/03/14   Phineas SemenGoodman, Graydon, MD  sulfamethoxazole-trimethoprim (BACTRIM DS,SEPTRA DS) 800-160 MG tablet Take 1 tablet by mouth 2 (two) times daily. 08/07/16   Mostafa Yuan, Delorise RoyalsJonathan D, PA-C  traMADol (ULTRAM) 50 MG tablet Take 1 tablet (50 mg total) by mouth 2 (two) times daily. 02/14/15   Menshew, Charlesetta IvoryJenise V Bacon, PA-C    Allergies Patient has no known allergies.  No family history on file.  Social History Social History  Substance Use Topics  . Smoking status: Never Smoker  . Smokeless tobacco: Never Used  . Alcohol use No     Review of Systems  Constitutional: No fever/chills Eyes: No visual changes.  Cardiovascular: no chest pain. Respiratory: no cough. No SOB. Gastrointestinal: No abdominal pain.  No nausea, no vomiting.   Musculoskeletal: Negative for musculoskeletal pain. Skin: Positive for swelling to bilateral cheeks as well as lesions oozing yellow fluid. Neurological: Negative for headaches, focal weakness or numbness. 10-point ROS otherwise negative.  ____________________________________________  PHYSICAL EXAM:  VITAL SIGNS: ED Triage Vitals [08/07/16 1736]  Enc Vitals Group     BP (!) 153/110     Pulse Rate (!) 101     Resp 16     Temp 99.2 F (37.3 C)     Temp Source Oral     SpO2 98 %     Weight 180 lb (81.6 kg)     Height 6\' 1"  (1.854 m)     Head Circumference      Peak Flow      Pain  Score 5     Pain Loc      Pain Edu?      Excl. in GC?      Constitutional: Alert and oriented. Well appearing and in no acute distress. Eyes: Conjunctivae are normal. PERRL. EOMI. Head: Atraumatic. ENT:      Ears:       Nose: No congestion/rhinnorhea.      Mouth/Throat: Mucous membranes are moist. No angioedema. Neck: No stridor.   Hematological/Lymphatic/Immunilogical: No cervical lymphadenopathy Cardiovascular: Normal rate, regular rhythm. Normal S1 and S2.  Good peripheral circulation. Respiratory: Normal respiratory effort without tachypnea or retractions. Lungs CTAB. Good air entry to the bases with no decreased or absent breath sounds. Musculoskeletal: Full range of motion to all extremities. No gross deformities appreciated. Neurologic:  Normal speech and language. No gross focal neurologic deficits are appreciated.  Skin:  Skin is warm, dry and intact. Patient has multiple lesions to bilateral cheeks and forehead. There are mild excoriations for scratching. There is honey colored drainage from lesions. No pus. No induration or fluctuance. Areas are mildly tender to palpation. Rashes bilateral face spanning the forehead, bilateral cheeks, to jawline. No perioral or periocular involvement. No angioedema. Psychiatric: Mood and affect are normal. Speech and behavior are normal. Patient exhibits appropriate insight and judgement.   ____________________________________________   LABS (all labs ordered are listed, but only abnormal results are displayed)  Labs Reviewed  COMPREHENSIVE METABOLIC PANEL - Abnormal; Notable for the following:       Result Value   Alkaline Phosphatase 27 (*)    All other components within normal limits  CBC WITH DIFFERENTIAL/PLATELET - Abnormal; Notable for the following:    WBC 12.1 (*)    Neutro Abs 7.5 (*)    Monocytes Absolute 1.1 (*)    All other components within normal limits    ____________________________________________  EKG   ____________________________________________  RADIOLOGY   No results found.  ____________________________________________    PROCEDURES  Procedure(s) performed:    Procedures    Medications - No data to display   ____________________________________________   INITIAL IMPRESSION / ASSESSMENT AND PLAN / ED COURSE  Pertinent labs & imaging results that were available during my care of the patient were reviewed by me and considered in my medical decision making (see chart for details).  Review of the [place] CSRS was performed in accordance of the [facility] prior to dispensing any controlled drugs.  Clinical Course as of Aug 08 1915  Wed Aug 07, 2016  1807 Patient's symptoms and physical exam are consistent with staph infection to the face. No indication of abscess requiring incision and drainage. Patient does have a history irregularly donating blood products including plasma. Patient did donate plasma 2 days prior. At this time, no petechial lesions, however I will order blood work to evaluate blood counts. These returned with reassuring results, I'll discharge patient home with antibiotics for apparent staph infection of the face.  [  JC]    Clinical Course User Index [JC] Legacy Lacivita, Delorise Royals, PA-C    Patient's diagnosis is consistent with Patient's cellulitis, likely staph versus impetigo. Patient had recent blood product urination. Labs were ordered to ensure no significant changes in my advice. Patient has mildly elevated white blood cell count consistent with bacterial infection. At this time, no indication of systemic involvement. No indication of abscess requiring incision and drainage or further evaluation with imaging.. No perioral or periocular involvement. No angioedema.. Patient will be discharged home with prescriptions for Bactrim and anti-inflammatories. Patient is to follow up with primary care as needed or  otherwise directed. Patient is given ED precautions to return to the ED for any worsening or new symptoms.     ____________________________________________  FINAL CLINICAL IMPRESSION(S) / ED DIAGNOSES  Final diagnoses:  Facial cellulitis      NEW MEDICATIONS STARTED DURING THIS VISIT:  New Prescriptions   MELOXICAM (MOBIC) 15 MG TABLET    Take 1 tablet (15 mg total) by mouth daily.   SULFAMETHOXAZOLE-TRIMETHOPRIM (BACTRIM DS,SEPTRA DS) 800-160 MG TABLET    Take 1 tablet by mouth 2 (two) times daily.        This chart was dictated using voice recognition software/Dragon. Despite best efforts to proofread, errors can occur which can change the meaning. Any change was purely unintentional.    Racheal Patches, PA-C 08/07/16 1917    Arnaldo Natal, MD 08/07/16 [phone]

## 2016-08-14 ENCOUNTER — Emergency Department: Payer: Self-pay

## 2016-08-14 ENCOUNTER — Emergency Department
Admission: EM | Admit: 2016-08-14 | Discharge: 2016-08-14 | Disposition: A | Discharge status: Home / Self Care | Payer: Self-pay | Attending: Emergency Medicine | Admitting: Emergency Medicine

## 2016-08-14 DIAGNOSIS — R079 Chest pain, unspecified: Secondary | ICD-10-CM | POA: Insufficient documentation

## 2016-08-14 DIAGNOSIS — W19XXXA Unspecified fall, initial encounter: Secondary | ICD-10-CM

## 2016-08-14 DIAGNOSIS — R42 Dizziness and giddiness: Secondary | ICD-10-CM | POA: Insufficient documentation

## 2016-08-14 DIAGNOSIS — R109 Unspecified abdominal pain: Secondary | ICD-10-CM | POA: Insufficient documentation

## 2016-08-14 DIAGNOSIS — Z79899 Other long term (current) drug therapy: Secondary | ICD-10-CM | POA: Insufficient documentation

## 2016-08-14 LAB — BASIC METABOLIC PANEL
Anion gap: 12 (ref 5–15)
BUN: 13 mg/dL (ref 6–20)
CO2: 23 mmol/L (ref 22–32)
Calcium: 10.6 mg/dL — ABNORMAL HIGH (ref 8.9–10.3)
Chloride: 103 mmol/L (ref 101–111)
Creatinine, Ser: 1.25 mg/dL — ABNORMAL HIGH (ref 0.61–1.24)
GFR calc Af Amer: >60 mL/min (ref >60)
GFR calc non Af Amer: >60 mL/min (ref >60)
Glucose, Bld: 117 mg/dL — ABNORMAL HIGH (ref 65–99)
Potassium: 4.4 mmol/L (ref 3.5–5.1)
Sodium: 138 mmol/L (ref 135–145)

## 2016-08-14 LAB — CBC
HCT: 48.2 % (ref 40.0–52.0)
Hemoglobin: 16.7 g/dL (ref 13.0–18.0)
MCH: 29.5 pg (ref 26.0–34.0)
MCHC: 34.6 g/dL (ref 32.0–36.0)
MCV: 85.1 fL (ref 80.0–100.0)
Platelets: 402 10*3/uL (ref 150–440)
RBC: 5.66 MIL/uL (ref 4.40–5.90)
RDW: 13.5 % (ref 11.5–14.5)
WBC: 14.9 10*3/uL — ABNORMAL HIGH (ref 3.8–10.6)

## 2016-08-14 LAB — GLUCOSE, CAPILLARY: Glucose-Capillary: 113 mg/dL — ABNORMAL HIGH (ref 65–99)

## 2016-08-14 MED ORDER — OXYCODONE-ACETAMINOPHEN 5-325 MG PO TABS
1.0000 | ORAL_TABLET | Freq: Once | ORAL | Status: AC
  Administered 2016-08-14: 1 via ORAL
  Filled 2016-08-14: qty 1

## 2016-08-14 MED ORDER — TRAMADOL HCL 50 MG PO TABS: 50.0000 mg | ORAL_TABLET | Freq: Four times a day (QID) | ORAL | 0 refills | Status: DC | PRN

## 2016-08-14 MED ORDER — KETOROLAC TROMETHAMINE 60 MG/2ML IM SOLN
60.0000 mg | Freq: Once | INTRAMUSCULAR | Status: AC
  Administered 2016-08-14: 60 mg via INTRAMUSCULAR
  Filled 2016-08-14: qty 2

## 2016-08-14 NOTE — ED Notes (Signed)
Pt taken to CT.

## 2016-08-14 NOTE — ED Notes (Signed)
Patient transported to CT 

## 2016-08-14 NOTE — ED Notes (Addendum)
Pt states fell down 10 steps yesterday. States he passed out. Pt has abraisons/rash to face that family member states has been treated with antibiotics. . States middle belly, L rib, and lower back pain. Pt is thrashing around from wheelchair to stretcher, not being careful with movement.

## 2016-08-14 NOTE — ED Triage Notes (Addendum)
Pt arrives to ER via POV c/o left rib pain after fall down approx 6 stairs last night approx 6PM. Pt denies neck pain. Pt does report LOC from fall. Pt has sores to face which he reports have been there for 1 week, he is currently being treated for cellulitis with PO antibiotics prescribed in ER.  Multiple syncopal episodes since fall. Pain with inspiration. Pt took 1 meloxicam PTA.    Pt stating explicits during triage due to pain.

## 2016-08-14 NOTE — ED Notes (Addendum)
Spoke with Dr. Alphonzo LemmingsMcShane regarding pt. Verbal orders received.

## 2016-08-14 NOTE — Discharge Instructions (Signed)
Please follow-up with your doctor in 2-3 days for recheck/reevaluation. Return to the emergency department for a significant worsening of pain or any other symptom personally concerning to yourself.

## 2016-08-14 NOTE — ED Provider Notes (Signed)
[facility] Emergency Department Provider Note  Time seen: 3:30 PM  I have reviewed the triage vital signs and the nursing notes.   HISTORY  Chief Complaint Fall; Rib Injury; and Loss of Consciousness    HPI [name] is a 31 y.o. male with no past medical history who presents to the emergency department after falling down stairs. According to the patient yesterday evening he fell down stairs at home. States since that time he has been having pain in his chest especially on the left side of he takes a deep breath. And pain in his abdomen. Patient reports last night he also felt like he was going to pass out lightheaded and dizzy. States that has passed but he continues to have significant pain in the left chest and left abdomen. Patient denies any headache or neck pain at this time. Denies any extremity pain.  History reviewed. No pertinent past medical history.  There are no active problems to display for this patient.   History reviewed. No pertinent surgical history.  Prior to Admission medications   Medication Sig Start Date End Date Taking? Authorizing Provider  acetaminophen-codeine (TYLENOL #3) 300-30 MG tablet Take 1-2 tablets by mouth every 4 (four) hours as needed for moderate pain. 10/12/14   Triplett, Rulon Eisenmengerari B, FNP  clindamycin (CLEOCIN) 150 MG capsule Begin taking medication on 09/16/15 2 capsules 4 times a day for 7 days. 09/15/15   Tommi RumpsSummers, Rhonda L, PA-C  cyclobenzaprine (FLEXERIL) 10 MG tablet Take 1 tablet (10 mg total) by mouth 3 (three) times daily as needed for muscle spasms. 10/12/14   Triplett, Rulon Eisenmengerari B, FNP  erythromycin ophthalmic ointment Place 1 application into both eyes 4 (four) times daily. 02/13/15   Menshew, Charlesetta IvoryJenise V Bacon, PA-C  HYDROcodone-acetaminophen (NORCO/VICODIN) 5-325 MG tablet Take 1-2 tablets by mouth every 4 (four) hours as needed for moderate pain. 09/15/15   Tommi RumpsSummers, Rhonda L, PA-C  ibuprofen (ADVIL,MOTRIN) 600 MG tablet  Take 1 tablet (600 mg total) by mouth every 8 (eight) hours as needed. 09/15/15   Tommi RumpsSummers, Rhonda L, PA-C  meloxicam (MOBIC) 15 MG tablet Take 1 tablet (15 mg total) by mouth daily. 08/07/16   Cuthriell, Delorise RoyalsJonathan D, PA-C  oxyCODONE-acetaminophen (ROXICET) 5-325 MG tablet Take 1 tablet by mouth every 4 (four) hours as needed for severe pain. 11/03/14   Phineas SemenGoodman, Graydon, MD  sulfamethoxazole-trimethoprim (BACTRIM DS,SEPTRA DS) 800-160 MG tablet Take 1 tablet by mouth 2 (two) times daily. 08/07/16   Cuthriell, Delorise RoyalsJonathan D, PA-C  traMADol (ULTRAM) 50 MG tablet Take 1 tablet (50 mg total) by mouth 2 (two) times daily. 02/14/15   Menshew, Charlesetta IvoryJenise V Bacon, PA-C    No Known Allergies  No family history on file.  Social History Social History  Substance Use Topics  . Smoking status: Never Smoker  . Smokeless tobacco: Never Used  . Alcohol use No    Review of Systems Constitutional: Negative for fever Cardiovascular: Positive for left lower chest pain. Respiratory: Negative for shortness of breath. States pain if he takes a deep breath. Gastrointestinal:Positive for intermittent left-sided abdominal pain Musculoskeletal: Negative for neck pain. States pain that radiates down into his lower back. Skin: Patient has abrasions of her face chest and abdomen. Several which she states are from yesterday however they appear older in appearance. Neurological: Negative for weakness or numbness. All other ROS negative  ____________________________________________   PHYSICAL EXAM:  VITAL SIGNS: ED Triage Vitals [08/14/16 1339]  Enc Vitals Group  BP (!) 156/80     Pulse Rate 95     Resp (!) 22     Temp (!) 97.5 F (36.4 C)     Temp Source Oral     SpO2 100 %     Weight 180 lb (81.6 kg)     Height 6\' 1"  (1.854 m)     Head Circumference      Peak Flow      Pain Score 10     Pain Loc      Pain Edu?      Excl. in GC?     Constitutional: Alert and oriented. Patient is in moderate distress  rolling around on the bed saying he is in discomfort. Eyes: Normal exam ENT   Head: Normocephalic. Older-appearing abrasions to his face. No signs of cellulitis or infection.   Mouth/Throat: Mucous membranes are moist. Cardiovascular: Normal rate, regular rhythm. No murmur Respiratory: Normal respiratory effort without tachypnea nor retractions. Breath sounds are clear. Chest is nontender to palpation. Gastrointestinal: Soft, moderate left upper quadrant tenderness. Older-appearing abrasions on chest and abdomen. Musculoskeletal: Nontender with normal range of motion in all extremities.  Neurologic:  Normal speech and language. No gross focal neurologic deficits Skin:  Skin is warm, dry and intact. Several older-appearing abrasions. Psychiatric: Mood and affect are normal.  ____________________________________________  EKG reviewed and interpreted by myself shows narrow QRS, normal axis, normal intervals with nonspecific ST changes but no ST elevation.  RADIOLOGY  CT head and neck negative X-ray negative  ____________________________________________   INITIAL IMPRESSION / ASSESSMENT AND PLAN / ED COURSE  Pertinent labs & imaging results that were available during my care of the patient were reviewed by me and considered in my medical decision making (see chart for details).  Patient presents to the emergency department after a reported fall yesterday. Patient states he is in significant pain and is asking for pain medication. He was given Percocet in triage. He is asking for us to place an IV for pain medication. At this time I see no acute findings of injury last night. Patient does have abrasions however this appears to be older. In fact the patient has an ER note from 08/07/16 also noting abrasions to the face. Patient's CT of the head and neck are negative. Left-sided rib films are negative. We will obtain a noncontrasted CT scan of the abdomen. Lab work is at baseline, with  mild leukocytosis.  CTs negative, labs are normal. At this time I do not have a clear reason for the patient's discomfort besides possible contusions. I reviewed the patient's narcotic database he has no record within the past year. We'll discharge the short course of Ultram for pain control.  ____________________________________________   FINAL CLINICAL IMPRESSION(S) / ED DIAGNOSES  Fall Contusions   Minna AntisPaduchowski, Kirah Stice, MD 08/14/16 [id number]

## 2016-09-10 ENCOUNTER — Encounter: Payer: Self-pay | Admitting: Emergency Medicine

## 2016-09-10 ENCOUNTER — Emergency Department: Payer: Self-pay

## 2016-09-10 ENCOUNTER — Emergency Department
Admission: EM | Admit: 2016-09-10 | Discharge: 2016-09-10 | Disposition: A | Discharge status: Home / Self Care | Payer: Self-pay | Attending: Emergency Medicine | Admitting: Emergency Medicine

## 2016-09-10 DIAGNOSIS — S61210A Laceration without foreign body of right index finger without damage to nail, initial encounter: Secondary | ICD-10-CM | POA: Insufficient documentation

## 2016-09-10 DIAGNOSIS — F17200 Nicotine dependence, unspecified, uncomplicated: Secondary | ICD-10-CM | POA: Insufficient documentation

## 2016-09-10 DIAGNOSIS — Z79899 Other long term (current) drug therapy: Secondary | ICD-10-CM | POA: Insufficient documentation

## 2016-09-10 DIAGNOSIS — Y929 Unspecified place or not applicable: Secondary | ICD-10-CM | POA: Insufficient documentation

## 2016-09-10 DIAGNOSIS — Y939 Activity, unspecified: Secondary | ICD-10-CM | POA: Insufficient documentation

## 2016-09-10 DIAGNOSIS — Y999 Unspecified external cause status: Secondary | ICD-10-CM | POA: Insufficient documentation

## 2016-09-10 DIAGNOSIS — W25XXXA Contact with sharp glass, initial encounter: Secondary | ICD-10-CM | POA: Insufficient documentation

## 2016-09-10 DIAGNOSIS — R52 Pain, unspecified: Secondary | ICD-10-CM

## 2016-09-10 MED ORDER — SULFAMETHOXAZOLE-TRIMETHOPRIM 800-160 MG PO TABS
1.0000 | ORAL_TABLET | Freq: Once | ORAL | Status: AC
  Administered 2016-09-10: 1 via ORAL
  Filled 2016-09-10: qty 1

## 2016-09-10 MED ORDER — SULFAMETHOXAZOLE-TRIMETHOPRIM 800-160 MG PO TABS: 1.0000 | ORAL_TABLET | Freq: Two times a day (BID) | ORAL | 0 refills | Status: DC

## 2016-09-10 NOTE — ED Provider Notes (Signed)
[facility] Emergency Department Provider Note ____________________________________________  Time seen: 1031  I have reviewed the triage vital signs and the nursing notes.  HISTORY  Chief Complaint  Hand Pain  HPI [name] is a 31 y.o. male presents to the ED for evaluation of soft tissue swelling over the dorsal MCP knuckle of the right hand index finger. He describes it better than 6 weeks ago he sustained a cut over the knuckle on a piece of glass. He reports he treated himself at home with butterfly tape over the wound. He reports the wound heal well butabout 2 weeks ago, he busted the wound open, slightly. He notes over the last week, he has hit the hand a few times during his work activities. He presents now with some swelling and firmness over the dorsal knuckle. He denies any spontaneous dehiscence or drainage. He denies any fevers, chills, sweats, or streaking up the arm.   History reviewed. No pertinent past medical history.  There are no active problems to display for this patient.  History reviewed. No pertinent surgical history.  Prior to Admission medications   Medication Sig Start Date End Date Taking? Authorizing Provider  acetaminophen-codeine (TYLENOL #3) 300-30 MG tablet Take 1-2 tablets by mouth every 4 (four) hours as needed for moderate pain. 10/12/14   Triplett, Rulon Eisenmenger B, FNP  clindamycin (CLEOCIN) 150 MG capsule Begin taking medication on 09/16/15 2 capsules 4 times a day for 7 days. 09/15/15   Tommi Rumps, PA-C  cyclobenzaprine (FLEXERIL) 10 MG tablet Take 1 tablet (10 mg total) by mouth 3 (three) times daily as needed for muscle spasms. 10/12/14   Triplett, Rulon Eisenmenger B, FNP  erythromycin ophthalmic ointment Place 1 application into both eyes 4 (four) times daily. 02/13/15   Allard Lightsey, Charlesetta Ivory, PA-C  HYDROcodone-acetaminophen (NORCO/VICODIN) 5-325 MG tablet Take 1-2 tablets by mouth every 4 (four) hours as needed for moderate pain.  09/15/15   Tommi Rumps, PA-C  ibuprofen (ADVIL,MOTRIN) 600 MG tablet Take 1 tablet (600 mg total) by mouth every 8 (eight) hours as needed. 09/15/15   Tommi Rumps, PA-C  meloxicam (MOBIC) 15 MG tablet Take 1 tablet (15 mg total) by mouth daily. 08/07/16   Cuthriell, Delorise Royals, PA-C  oxyCODONE-acetaminophen (ROXICET) 5-325 MG tablet Take 1 tablet by mouth every 4 (four) hours as needed for severe pain. 11/03/14   Phineas Semen, MD  sulfamethoxazole-trimethoprim (BACTRIM DS,SEPTRA DS) 800-160 MG tablet Take 1 tablet by mouth 2 (two) times daily. 09/10/16   Alandria Butkiewicz, Charlesetta Ivory, PA-C  traMADol (ULTRAM) 50 MG tablet Take 1 tablet (50 mg total) by mouth every 6 (six) hours as needed. 08/14/16   Minna Antis, MD    Allergies Patient has no known allergies.  History reviewed. No pertinent family history.  Social History Social History  Substance Use Topics  . Smoking status: Current Every Day Smoker  . Smokeless tobacco: Never Used  . Alcohol use No    Review of Systems  Constitutional: Negative for fever. Musculoskeletal: Negative for back pain. Skin: Negative for rash. Right knuckle swelling as above. Neurological: Negative for headaches, focal weakness or numbness. ____________________________________________  PHYSICAL EXAM:  VITAL SIGNS: ED Triage Vitals  Enc Vitals Group     BP 09/10/16 0959 (!) 147/92     Pulse Rate 09/10/16 0959 (!) 102     Resp 09/10/16 0959 18     Temp 09/10/16 0959 98.4 F (36.9 C)     Temp Source  09/10/16 0959 Oral     SpO2 09/10/16 0959 100 %     Weight 09/10/16 1000 180 lb (81.6 kg)     Height 09/10/16 1000 6\' 1"  (1.854 m)     Head Circumference --      Peak Flow --      Pain Score 09/10/16 0959 3     Pain Loc --      Pain Edu? --      Excl. in GC? --     Constitutional: Alert and oriented. Well appearing and in no distress. Head: Normocephalic and atraumatic. Cardiovascular: Normal distal pulses. Normal capillary  refill Respiratory: Normal respiratory effort.  Musculoskeletal: Normal composite fist on the right. Nontender with normal range of motion in all extremities.  Neurologic:  Normal gross sensation. Normal speech and language. No gross focal neurologic deficits are appreciated. Skin:  Skin is warm, dry and intact. Right dorsal index finger MCP with focal firm, callous formation with central scab. No fluctuance, warmth, or lymphangitis.  ____________________________________________   RADIOLOGY  Right Hand  IMPRESSION: No acute or focal abnormality.  I, Caylor Cerino, Charlesetta Ivory, personally viewed and evaluated these images (plain radiographs) as part of my medical decision making, as well as reviewing the written report by the radiologist. ____________________________________________  PROCEDURES  Bactrim DS 1 PO ____________________________________________  INITIAL IMPRESSION / ASSESSMENT AND PLAN / ED COURSE  Patient with the ED evaluation of a chronic wound callus and potential cellulitis to the right dorsal knuckle. There is no radiology evidence of a retained foreign body. The wound overall does not appear secondarily infected at this time. Patient will be treated empirically with Bactrim for cellulitis. He will continue to monitor the wound, keeping it covered for protection from work. He should return to the ED or [place] urgent care for wound check as needed. ____________________________________________  FINAL CLINICAL IMPRESSION(S) / ED DIAGNOSES  Final diagnoses:  Laceration of right index finger w/o foreign body w/o damage to nail, initial encounter      Lissa Hoard, PA-C 09/10/16 1629    Jene Every, MD 09/13/16 [phone]

## 2016-09-10 NOTE — ED Triage Notes (Signed)
Pt c/o right hand pain.  1 month ago pt cut hand on glass and he did "butterfly" stitches himself. Reports it healed but now has been hurting worse and severe pain when moves hand.

## 2016-09-10 NOTE — ED Notes (Signed)
First Nurse Note:  Patient complaining of lack of sleep and right hand pain, given an ice pack for right hand.

## 2016-09-10 NOTE — Discharge Instructions (Signed)
Your x-ray was negative for any foreign body. Take the antibiotic as directed. Keep the wound covered for protection. Follow-up with this department as needed.

## 2016-09-10 NOTE — ED Notes (Addendum)
See triage note  States he cut his right hand about 1 month ago  States laceration was at knuckle and he butterflied the area    Then he cut the same area again superficially  But over the past several days area is swollen and tender

## 2016-10-26 ENCOUNTER — Encounter: Payer: Self-pay | Admitting: Emergency Medicine

## 2016-10-26 ENCOUNTER — Emergency Department
Admission: EM | Admit: 2016-10-26 | Discharge: 2016-10-26 | Disposition: A | Discharge status: Home / Self Care | Payer: Managed Care, Other (non HMO) | Attending: Emergency Medicine | Admitting: Emergency Medicine

## 2016-10-26 DIAGNOSIS — K047 Periapical abscess without sinus: Secondary | ICD-10-CM | POA: Insufficient documentation

## 2016-10-26 DIAGNOSIS — Z79899 Other long term (current) drug therapy: Secondary | ICD-10-CM | POA: Insufficient documentation

## 2016-10-26 DIAGNOSIS — F17210 Nicotine dependence, cigarettes, uncomplicated: Secondary | ICD-10-CM | POA: Insufficient documentation

## 2016-10-26 MED ORDER — CLINDAMYCIN HCL 300 MG PO CAPS: 300.0000 mg | ORAL_CAPSULE | Freq: Three times a day (TID) | ORAL | 0 refills | Status: AC

## 2016-10-26 MED ORDER — LIDOCAINE VISCOUS 2 % MT SOLN: 10.0000 mL | OROMUCOSAL | 0 refills | Status: DC | PRN

## 2016-10-26 MED ORDER — CLINDAMYCIN PHOSPHATE 600 MG/4ML IJ SOLN: 600.0000 mg | Freq: Once | INTRAMUSCULAR | Status: DC

## 2016-10-26 MED ORDER — CLINDAMYCIN PHOSPHATE 900 MG/6ML IJ SOLN
600.0000 mg | Freq: Once | INTRAMUSCULAR | Status: AC
  Administered 2016-10-26: 600 mg via INTRAMUSCULAR
  Filled 2016-10-26: qty 4

## 2016-10-26 MED ORDER — KETOROLAC TROMETHAMINE 30 MG/ML IJ SOLN
30.0000 mg | Freq: Once | INTRAMUSCULAR | Status: AC
  Administered 2016-10-26: 30 mg via INTRAMUSCULAR
  Filled 2016-10-26: qty 1

## 2016-10-26 MED ORDER — LIDOCAINE VISCOUS 2 % MT SOLN
15.0000 mL | Freq: Once | OROMUCOSAL | Status: AC
  Administered 2016-10-26: 15 mL via OROMUCOSAL
  Filled 2016-10-26: qty 15

## 2016-10-26 MED ORDER — OXYCODONE-ACETAMINOPHEN 5-325 MG PO TABS
1.0000 | ORAL_TABLET | Freq: Once | ORAL | Status: AC
  Administered 2016-10-26: 1 via ORAL
  Filled 2016-10-26: qty 1

## 2016-10-26 NOTE — ED Notes (Signed)
Patient given ice pack at this time.

## 2016-10-26 NOTE — ED Provider Notes (Signed)
[facility] Emergency Department Provider Note  ____________________________________________  Time seen: Approximately 12:18 PM  I have reviewed the triage vital signs and the nursing notes.   HISTORY  Chief Complaint Dental Pain    HPI [name] is a 32 y.o. male that presents to the emergency deparmtent with lower right-sided dental pain for one week that worsened today. Patient states that he woke up this morning with the right side of his face swelling. He has not been able to see a dentist because he does not have insurance. No fever, drainage from mouth, nausea, vomiting, abdominal pain.   History reviewed. No pertinent past medical history.  There are no active problems to display for this patient.   History reviewed. No pertinent surgical history.  Prior to Admission medications   Medication Sig Start Date End Date Taking? Authorizing Provider  acetaminophen-codeine (TYLENOL #3) 300-30 MG tablet Take 1-2 tablets by mouth every 4 (four) hours as needed for moderate pain. 10/12/14   Triplett, Rulon Eisenmenger B, FNP  clindamycin (CLEOCIN) 300 MG capsule Take 1 capsule (300 mg total) by mouth 3 (three) times daily. 10/26/16 11/05/16  Enid Derry, PA-C  cyclobenzaprine (FLEXERIL) 10 MG tablet Take 1 tablet (10 mg total) by mouth 3 (three) times daily as needed for muscle spasms. 10/12/14   Triplett, Rulon Eisenmenger B, FNP  erythromycin ophthalmic ointment Place 1 application into both eyes 4 (four) times daily. 02/13/15   Menshew, Charlesetta Ivory, PA-C  HYDROcodone-acetaminophen (NORCO/VICODIN) 5-325 MG tablet Take 1-2 tablets by mouth every 4 (four) hours as needed for moderate pain. 09/15/15   Tommi Rumps, PA-C  ibuprofen (ADVIL,MOTRIN) 600 MG tablet Take 1 tablet (600 mg total) by mouth every 8 (eight) hours as needed. 09/15/15   Tommi Rumps, PA-C  lidocaine (XYLOCAINE) 2 % solution Use as directed 10 mLs in the mouth or throat as needed for mouth pain.  10/26/16   Enid Derry, PA-C  meloxicam (MOBIC) 15 MG tablet Take 1 tablet (15 mg total) by mouth daily. 08/07/16   Cuthriell, Delorise Royals, PA-C  oxyCODONE-acetaminophen (ROXICET) 5-325 MG tablet Take 1 tablet by mouth every 4 (four) hours as needed for severe pain. 11/03/14   Phineas Semen, MD  sulfamethoxazole-trimethoprim (BACTRIM DS,SEPTRA DS) 800-160 MG tablet Take 1 tablet by mouth 2 (two) times daily. 09/10/16   Menshew, Charlesetta Ivory, PA-C  traMADol (ULTRAM) 50 MG tablet Take 1 tablet (50 mg total) by mouth every 6 (six) hours as needed. 08/14/16   Minna Antis, MD    Allergies Patient has no known allergies.  No family history on file.  Social History Social History  Substance Use Topics  . Smoking status: Current Every Day Smoker    Packs/day: 0.50    Types: Cigarettes  . Smokeless tobacco: Never Used  . Alcohol use No     Review of Systems  Constitutional: No fever/chills Cardiovascular: No chest pain. Respiratory: No SOB. Gastrointestinal: No abdominal pain.  No nausea, no vomiting.  Musculoskeletal: Negative for musculoskeletal pain. Skin: Negative for rash, abrasions, lacerations, ecchymosis. Neurological: Negative for headaches, numbness or tingling   ____________________________________________   PHYSICAL EXAM:  VITAL SIGNS: ED Triage Vitals [10/26/16 1102]  Enc Vitals Group     BP 127/82     Pulse Rate 92     Resp 18     Temp 98.3 F (36.8 C)     Temp Source Oral     SpO2 95 %     Weight  180 lb (81.6 kg)     Height  (1.854 m)     Head Circumference      Peak Flow      Pain Score 7     Pain Loc      Pain Edu?      Excl. in GC?      Constitutional: Alert and oriented. Well appearing and in no acute distress. Eyes: Conjunctivae are normal. PERRL. EOMI. Head: Atraumatic. ENT:      Ears:      Nose: No congestion/rhinnorhea.      Mouth/Throat: Mucous membranes are moist. Mild swelling to right cheek. Tenderness to palpation over  lower right molar. No drainage from mouth. No TMJ pain. Neck: No stridor.   Cardiovascular: Normal rate, regular rhythm.  Good peripheral circulation. Respiratory: Normal respiratory effort without tachypnea or retractions. Lungs CTAB. Good air entry to the bases with no decreased or absent breath sounds. Musculoskeletal: Full range of motion to all extremities. No gross deformities appreciated. Neurologic:  Normal speech and language. No gross focal neurologic deficits are appreciated.  Skin:  Skin is warm, dry and intact. No rash noted.   ____________________________________________   LABS (all labs ordered are listed, but only abnormal results are displayed)  Labs Reviewed - No data to display ____________________________________________  EKG   ____________________________________________  RADIOLOGY  No results found.  ____________________________________________    PROCEDURES  Procedure(s) performed:    Procedures    Medications  ketorolac (TORADOL) 30 MG/ML injection 30 mg (30 mg Intramuscular Given 10/26/16 1225)  oxyCODONE-acetaminophen (PERCOCET/ROXICET) 5-325 MG per tablet 1 tablet (1 tablet Oral Given 10/26/16 1225)  lidocaine (XYLOCAINE) 2 % viscous mouth solution 15 mL (15 mLs Mouth/Throat Given 10/26/16 1225)  clindamycin (CLEOCIN) injection 600 mg (600 mg Intramuscular Given 10/26/16 1306)     ____________________________________________   INITIAL IMPRESSION / ASSESSMENT AND PLAN / ED COURSE  Pertinent labs & imaging results that were available during my care of the patient were reviewed by me and considered in my medical decision making (see chart for details).  Review of the [place] CSRS was performed in accordance of the [facility] prior to dispensing any controlled drugs.   Patient's diagnosis is consistent with dental abscess. Vital signs and exam are reassuring. Patient was given IM clindamycin, IM Toradol, and oral percocet in ED. Patient will be  discharged home with prescriptions for clindamycin and viscous lidocaine. Patient is to follow up with dentist as directed. Patient is given ED precautions to return to the ED for any worsening or new symptoms.     ____________________________________________  FINAL CLINICAL IMPRESSION(S) / ED DIAGNOSES  Final diagnoses:  Dental abscess      NEW MEDICATIONS STARTED DURING THIS VISIT:  Discharge Medication List as of 10/26/2016  1:06 PM    START taking these medications   Details  lidocaine (XYLOCAINE) 2 % solution Use as directed 10 mLs in the mouth or throat as needed for mouth pain., Starting Sat 10/26/2016, Print            This chart was dictated using voice recognition software/Dragon. Despite best efforts to proofread, errors can occur which can change the meaning. Any change was purely unintentional.    Enid Derry, PA-C 10/26/16 1353    Pershing Proud Myra Rude, MD 10/26/16 [phone]

## 2016-10-26 NOTE — Discharge Instructions (Signed)
OPTIONS FOR DENTAL FOLLOW UP CARE ° °[place] Department of Health and Human Services - Local Safety Net Dental Clinics °http://www.ncdhhs.gov/dph/oralhealth/services/safetynetclinics.htm °  °[facility] (336-562-3123) ° °[facility] ([phone]) ° °[facility] ([phone] ext [phone]) ° ° [facility] ([phone]) ° °[facility] ([phone]) °This clinic caters to the indigent population and is on a lottery system. °Location: °[facility], [address], [place] °Clinic Hours: °Wednesdays from 6pm - 9pm, patients seen by a lottery system. °For dates, call or go to [email or web address]/[facility]/[email or web address] °Services: °Cleanings, fillings and simple extractions. °Payment Options: °DENTAL WORK IS FREE OF CHARGE. Bring proof of income or support. °Best way to get seen: °Arrive at 5:15 pm - this is a lottery, NOT first come/first serve, so arriving earlier will not increase your chances of being seen. °  °  °[facility] °[phone] °Select option 1 for emergencies °  °Location: °[facility], [address], [place] °Clinic Hours: °No walk-ins accepted - call the day before to schedule an appointment. °Check in times are 9:30 am and 1:30 pm. °Services: °Simple extractions, temporary fillings, pulpectomy/pulp debridement, uncomplicated abscess drainage. °Payment Options: °PAYMENT IS DUE AT THE TIME OF SERVICE.  Fee is usually $100-200, additional surgical procedures (e.g. abscess drainage) may be extra. °Cash, checks, Visa/MasterCard accepted.  Can file Medicaid if patient is covered for dental - patient should call case worker to check. °No discount for [facility] patients. °Best way to get seen: °MUST call the day before and get onto the schedule. Can usually be seen the next 1-2 days. No walk-ins accepted. °  °  °[facility] °[phone] °   °Location: °[facility], [address], [place] °Clinic Hours: °M, W, Th, F 8am or 1:30pm, Tues 9a or 1:30 - first come/first served. °Services: °Simple extractions, temporary fillings, uncomplicated abscess drainage.  You do not need to be an [place] resident. °Payment Options: °PAYMENT IS DUE AT THE TIME OF SERVICE. °Dental insurance, otherwise sliding scale - bring proof of income or support. °Depending on income and treatment needed, cost is usually $50-200. °Best way to get seen: °Arrive early as it is first come/first served. °  °  °[facility] °[phone] °  °Location: °[address] °Clinic Hours: °Mon-Thu 8a-5p °Services: °Most basic dental services including extractions and fillings. °Payment Options: °PAYMENT IS DUE AT THE TIME OF SERVICE. °Sliding scale, up to 50% off - bring proof if income or support. °Medicaid with dental option accepted. °Best way to get seen: °Call to schedule an appointment, can usually be seen within 2 weeks OR they will try to see walk-ins - show up at 8a or 2p (you may have to wait). °  °  °[facility] °[phone] °[place] RESIDENTS ONLY °  °Location: °[facility], [address], [place], [place] [address] °Clinic Hours: By appointment only. °Monday - Thursday 8am-5pm, Friday 8am-12pm °Services: Cleanings, fillings, extractions. °Payment Options: °PAYMENT IS DUE AT THE TIME OF SERVICE. °Cash, Visa or MasterCard. Sliding scale - $30 minimum per service. °Best way to get seen: °Come in to office, complete packet and make an appointment - need proof of income °or support monies for each household member and proof of [place] residence. °Usually takes about a month to get in. °  °  °[facility] °[phone] °  °Location: °[address]   [place] °Clinic Hours: Walk-in Urgent Care Dental Services are offered Monday-Friday  mornings only. °The numbers of emergencies accepted daily is limited to the number of °providers available. °Maximum 15 - Mondays, Wednesdays & Thursdays °Maximum 10 - Tuesdays & Fridays °Services: °You do not need to be a [place] resident to be seen for a dental emergency. °Emergencies are defined as pain, swelling, abnormal bleeding, or dental trauma. Walkins will receive x-rays if needed. °NOTE: Dental cleaning is not an emergency. °Payment Options: °PAYMENT IS DUE AT THE TIME OF SERVICE. °Minimum co-pay is $40.00 for uninsured patients. °Minimum co-pay is $3.00 for Medicaid with dental coverage. °Dental Insurance is accepted and must be presented at time of visit. °Medicare does not cover dental. °Forms of payment: Cash, credit card, checks. °Best way to get seen: °If not previously registered with the clinic, walk-in dental registration begins at 7:15 am and is on a first come/first serve basis. °If previously registered with the clinic, call to make an appointment. °  °  °[facility] °[phone] °[place] RESIDENTS ONLY °  °Location: °[address], [place], [place] °Clinic Hours: °Mon-Thu 10a-2p °Services: Extractions only! °Payment Options: °FREE (donations accepted) - bring proof of income or support °Best way to get seen: °Call and schedule an appointment OR come at 8am on the 1st Monday of every month (except for holidays) when it is first come/first served. °  °  °[facility] °[phone] °  °Location: °[address], [place] °Clinic Hours: °Friday mornings °Services, Payment Options, Best way to get seen: °Call for info °

## 2016-10-26 NOTE — ED Notes (Signed)
Patient is complaining of severe right jaw dental pain.  Patient states area has been hurting slightly for over 1 week but patient states he woke up to severe pain yesterday and to swelling of his left jaw area today.  Patient is tearful during assessment, holding an ice pack to left jaw area.

## 2016-10-26 NOTE — ED Triage Notes (Signed)
R lower dental pain x 2 days.  

## 2016-10-26 NOTE — ED Triage Notes (Signed)
Right jaw dental pain,

## 2016-12-17 ENCOUNTER — Emergency Department (HOSPITAL_COMMUNITY): Payer: No Typology Code available for payment source

## 2016-12-17 ENCOUNTER — Emergency Department (HOSPITAL_COMMUNITY)
Admission: EM | Admit: 2016-12-17 | Discharge: 2016-12-17 | Discharge status: Against Medical Advice | Payer: No Typology Code available for payment source | Attending: Emergency Medicine | Admitting: Emergency Medicine

## 2016-12-17 ENCOUNTER — Encounter (HOSPITAL_COMMUNITY): Payer: Self-pay

## 2016-12-17 DIAGNOSIS — F1210 Cannabis abuse, uncomplicated: Secondary | ICD-10-CM | POA: Insufficient documentation

## 2016-12-17 DIAGNOSIS — Z87891 Personal history of nicotine dependence: Secondary | ICD-10-CM | POA: Diagnosis not present

## 2016-12-17 DIAGNOSIS — R002 Palpitations: Secondary | ICD-10-CM | POA: Diagnosis not present

## 2016-12-17 DIAGNOSIS — Z5320 Procedure and treatment not carried out because of patient's decision for unspecified reasons: Secondary | ICD-10-CM | POA: Diagnosis not present

## 2016-12-17 DIAGNOSIS — Y9389 Activity, other specified: Secondary | ICD-10-CM | POA: Diagnosis not present

## 2016-12-17 DIAGNOSIS — F1110 Opioid abuse, uncomplicated: Secondary | ICD-10-CM | POA: Insufficient documentation

## 2016-12-17 DIAGNOSIS — Z79899 Other long term (current) drug therapy: Secondary | ICD-10-CM | POA: Insufficient documentation

## 2016-12-17 DIAGNOSIS — Y998 Other external cause status: Secondary | ICD-10-CM | POA: Diagnosis not present

## 2016-12-17 DIAGNOSIS — M25552 Pain in left hip: Secondary | ICD-10-CM | POA: Insufficient documentation

## 2016-12-17 DIAGNOSIS — Y92410 Unspecified street and highway as the place of occurrence of the external cause: Secondary | ICD-10-CM | POA: Diagnosis not present

## 2016-12-17 DIAGNOSIS — R0789 Other chest pain: Secondary | ICD-10-CM | POA: Diagnosis present

## 2016-12-17 DIAGNOSIS — M549 Dorsalgia, unspecified: Secondary | ICD-10-CM | POA: Insufficient documentation

## 2016-12-17 LAB — COMPREHENSIVE METABOLIC PANEL
ALT: 19 U/L (ref 17–63)
AST: 31 U/L (ref 15–41)
Albumin: 3.9 g/dL (ref 3.5–5.0)
Alkaline Phosphatase: 36 U/L — ABNORMAL LOW (ref 38–126)
Anion gap: 10 (ref 5–15)
BUN: 12 mg/dL (ref 6–20)
CO2: 26 mmol/L (ref 22–32)
Calcium: 9.3 mg/dL (ref 8.9–10.3)
Chloride: 102 mmol/L (ref 101–111)
Creatinine, Ser: 0.94 mg/dL (ref 0.61–1.24)
GFR calc Af Amer: >60 mL/min (ref >60)
GFR calc non Af Amer: >60 mL/min (ref >60)
Glucose, Bld: 85 mg/dL (ref 65–99)
Potassium: 3.9 mmol/L (ref 3.5–5.1)
Sodium: 138 mmol/L (ref 135–145)
Total Bilirubin: 0.4 mg/dL (ref 0.3–1.2)
Total Protein: 7.1 g/dL (ref 6.5–8.1)

## 2016-12-17 LAB — CBC
HCT: 41 % (ref 39.0–52.0)
Hemoglobin: 13.5 g/dL (ref 13.0–17.0)
MCH: 29.5 pg (ref 26.0–34.0)
MCHC: 32.9 g/dL (ref 30.0–36.0)
MCV: 89.7 fL (ref 78.0–100.0)
Platelets: 277 10*3/uL (ref 150–400)
RBC: 4.57 MIL/uL (ref 4.22–5.81)
RDW: 14.7 % (ref 11.5–15.5)
WBC: 14.8 10*3/uL — ABNORMAL HIGH (ref 4.0–10.5)

## 2016-12-17 LAB — I-STAT CHEM 8, ED
BUN: 14 mg/dL (ref 6–20)
Calcium, Ion: 1.16 mmol/L (ref 1.15–1.40)
Chloride: 102 mmol/L (ref 101–111)
Creatinine, Ser: 0.9 mg/dL (ref 0.61–1.24)
Glucose, Bld: 85 mg/dL (ref 65–99)
HCT: 42 % (ref 39.0–52.0)
Hemoglobin: 14.3 g/dL (ref 13.0–17.0)
Potassium: 3.8 mmol/L (ref 3.5–5.1)
Sodium: 141 mmol/L (ref 135–145)
TCO2: 28 mmol/L (ref 22–32)

## 2016-12-17 LAB — SAMPLE TO BLOOD BANK

## 2016-12-17 LAB — CDS SEROLOGY

## 2016-12-17 LAB — ETHANOL: Alcohol, Ethyl (B): <10 mg/dL (ref <10)

## 2016-12-17 LAB — I-STAT CG4 LACTIC ACID, ED: Lactic Acid, Venous: 1.44 mmol/L (ref 0.5–1.9)

## 2016-12-17 LAB — PROTIME-INR
INR: 1.05
Prothrombin Time: 13.7 seconds (ref 11.4–15.2)

## 2016-12-17 MED ORDER — FENTANYL CITRATE (PF) 100 MCG/2ML IJ SOLN: 50.0000 ug | Freq: Once | INTRAMUSCULAR | Status: DC

## 2016-12-17 MED ORDER — SODIUM CHLORIDE 0.9 % IV BOLUS (SEPSIS): 1000.0000 mL | Freq: Once | INTRAVENOUS | Status: DC

## 2016-12-17 MED ORDER — IOPAMIDOL (ISOVUE-300) INJECTION 61%
INTRAVENOUS | Status: AC
  Filled 2016-12-17: qty 100
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
## 2016-12-17 NOTE — ED Notes (Signed)
Portable at bedside 

## 2016-12-17 NOTE — ED Notes (Signed)
ED Provider at bedside. 
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
## 2016-12-17 NOTE — ED Notes (Signed)
Joselyn Glassmanyler PA, spoke with the pt about the risks of leaving AMA, pt still agreed to leave AMA. Pt pulled off all telemetry lines and BP cuff and got dressed. RN removed IV and pt walked out.
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
## 2018-02-28 DIAGNOSIS — F10920 Alcohol use, unspecified with intoxication, uncomplicated: Secondary | ICD-10-CM
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
## 2018-02-28 NOTE — ED Provider Notes (Signed)
EMERGENCY DEPARTMENT HISTORY AND PHYSICAL EXAM    Date: 02/28/2018  Patient Name: Rodney Barnes    History of Presenting Illness     Chief Complaint   Patient presents with   ??? Alcohol intoxication         History Provided By: Patient and EMS    Additional History (Context): Rodney Barnes is a 33 y.o. male with No significant past medical history who presents with alcohol intoxication.  Patient was seen exiting a bar and got into someone's car who felt very uncomfortable with patient's demeanor and so dropped him off at a police station.  Police called EMS who transported the patient here.  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  He had a room at a hotel tonight but did not make it there.  He denies any illicits or trauma.  He does not have any abdominal pain.  He smokes cigarettes.    PCP: None        Past History     Past Medical History:  No past medical history on file.    Past Surgical History:  No past surgical history on file.    Family History:  No family history on file.    Social History:  Social History     Tobacco Use   ??? Smoking status: Not on file   Substance Use Topics   ??? Alcohol use: Not on file   ??? Drug use: Not on file       Allergies:  No Known Allergies      Review of Systems   Review of Systems   Constitutional: Negative.    HENT: Negative.    Eyes: Negative.    Respiratory: Negative.    Cardiovascular: Negative.    Gastrointestinal: Negative for abdominal pain, nausea and vomiting.   Endocrine: Negative.    Genitourinary: Negative.    Musculoskeletal: Positive for gait problem.   Skin: Negative.    Allergic/Immunologic: Negative.    Hematological: Negative.    Psychiatric/Behavioral: Negative.      All Other Systems Negative  Physical Exam     Vitals:    02/28/18 2315 02/28/18 2330 02/28/18 2345 03/01/18 0015   BP: 106/57 123/65 121/69 116/74   Pulse: 80 77 82 99   Resp: 15 15 15 21    Temp:    97.4 ??F (36.3 ??C)   SpO2: 95% 95% 96% 100%      Physical Exam  Vitals signs and nursing note reviewed.   Constitutional:       General: He is not in acute distress.     Appearance: He is well-developed. He is not ill-appearing, toxic-appearing or diaphoretic.   HENT:      Head: Normocephalic and atraumatic.   Neck:      Musculoskeletal: Normal range of motion and neck supple.      Thyroid: No thyromegaly.      Vascular: No carotid bruit.      Trachea: No tracheal deviation.   Cardiovascular:      Rate and Rhythm: Normal rate and regular rhythm.      Heart sounds: Normal heart sounds. No murmur. No friction rub. No gallop.    Pulmonary:      Effort: Pulmonary effort is normal. No respiratory distress.      Breath sounds: Normal breath sounds. No stridor. No wheezing or rales.   Chest:      Chest wall: No tenderness.   Abdominal:  General: There is no distension.      Palpations: Abdomen is soft. There is no mass.      Tenderness: There is no abdominal tenderness. There is no guarding or rebound.   Musculoskeletal: Normal range of motion.   Skin:     General: Skin is warm and dry.      Coloration: Skin is not pale.   Neurological:      Mental Status: He is alert and oriented to person, place, and time.      Comments: Slightly unsteady on his feet.  Patient began to sway back and forth when he was standing next to the bedside  He was placed immediately back into the bed and was told not to get out again.  Removed his shoes and socks.   Psychiatric:         Speech: Speech normal.         Behavior: Behavior normal.         Thought Content: Thought content normal.         Judgment: Judgment normal.      Comments: ETOH odor of breath.            Diagnostic Study Results     Labs -     Recent Results (from the past 12 hour(s))   CBC WITH AUTOMATED DIFF    Collection Time: 02/28/18 10:15 PM   Result Value Ref Range    WBC 14.6 (H) 4.6 - 13.2 K/uL    RBC 5.00 4.70 - 5.50 M/uL    HGB 15.5 13.0 - 16.0 g/dL    HCT 44.2 36.0 - 48.0 %    MCV 88.4 74.0 - 97.0 FL     MCH 31.0 24.0 - 34.0 PG    MCHC 35.1 31.0 - 37.0 g/dL    RDW 13.9 11.6 - 14.5 %    PLATELET 276 135 - 420 K/uL    MPV 9.7 9.2 - 11.8 FL    NEUTROPHILS 61 42 - 75 %    LYMPHOCYTES 33 20 - 51 %    MONOCYTES 5 2 - 9 %    EOSINOPHILS 1 0 - 5 %    BASOPHILS 0 0 - 3 %    ABS. NEUTROPHILS 9.0 (H) 1.8 - 8.0 K/UL    ABS. LYMPHOCYTES 4.8 (H) 0.8 - 3.5 K/UL    ABS. MONOCYTES 0.7 0 - 1.0 K/UL    ABS. EOSINOPHILS 0.1 0.0 - 0.4 K/UL    ABS. BASOPHILS 0.0 0.0 - 0.06 K/UL    DF MANUAL      PLATELET COMMENTS ADEQUATE PLATELETS      RBC COMMENTS NORMOCYTIC, NORMOCHROMIC     METABOLIC PANEL, COMPREHENSIVE    Collection Time: 02/28/18 10:15 PM   Result Value Ref Range    Sodium 140 136 - 145 mmol/L    Potassium 3.8 3.5 - 5.5 mmol/L    Chloride 105 100 - 111 mmol/L    CO2 27 21 - 32 mmol/L    Anion gap 8 3.0 - 18 mmol/L    Glucose 80 74 - 99 mg/dL    BUN 14 7.0 - 18 MG/DL    Creatinine 1.00 0.6 - 1.3 MG/DL    BUN/Creatinine ratio 14 12 - 20      GFR est AA >60 >60 ml/min/1.44m    GFR est non-AA >60 >60 ml/min/1.787m   Calcium 8.6 8.5 - 10.1 MG/DL    Bilirubin, total 0.3 0.2 - 1.0 MG/DL  ALT (SGPT) 37 16 - 61 U/L    AST (SGOT) 40 (H) 10 - 38 U/L    Alk. phosphatase 45 45 - 117 U/L    Protein, total 8.8 (H) 6.4 - 8.2 g/dL    Albumin 4.5 3.4 - 5.0 g/dL    Globulin 4.3 (H) 2.0 - 4.0 g/dL    A-G Ratio 1.0 0.8 - 1.7     LIPASE    Collection Time: 02/28/18 10:15 PM   Result Value Ref Range    Lipase 112 73 - 393 U/L   MAGNESIUM    Collection Time: 02/28/18 10:15 PM   Result Value Ref Range    Magnesium 3.0 (H) 1.6 - 2.6 mg/dL       Radiologic Studies -   No orders to display     CT Results  (Last 48 hours)    None        CXR Results  (Last 48 hours)    None            Medical Decision Making   I am the first provider for this patient.    I reviewed the vital signs, available nursing notes, past medical history, past surgical history, family history and social history.    Vital Signs-Reviewed the patient's vital signs.      Procedures:   Procedures    Provider Notes (Medical Decision Making): admitted to ETOH and appears intoxicated.  Coherent but slightly unsteady gait.  Will wait til clinically sober and discharge.    2:38 AM : Pt care transferred to Dr. Janene Madeira provider. History of patient complaint(s), available diagnostic reports and current treatment plan has been discussed thoroughly.   Bedside rounding on patient occured : no .  Intended disposition of patient :home  Pending diagnostics reports and/or labs (please list): clinical sobriety      MED RECONCILIATION:  No current facility-administered medications for this encounter.      No current outpatient medications on file.       Disposition:  TBD      Follow-up Information     Follow up With Specialties Details [place]  Schedule an appointment as soon as possible for a visit in 1 day As needed [place]  [phone]    [facility] EMERGENCY DEPT Emergency Medicine  If symptoms worsen return immediately [place]  [phone]          There are no discharge medications for this patient.      Clinical Impression:   1. Alcoholic intoxication without complication ([place])

## 2018-03-01 ENCOUNTER — Inpatient Hospital Stay
Admit: 2018-03-01 | Discharge: 2018-03-01 | Disposition: A | Discharge status: Home / Self Care | Payer: Self-pay | Attending: Emergency Medicine

## 2018-03-01 LAB — METABOLIC PANEL, COMPREHENSIVE
A-G Ratio: 1 (ref 0.8–1.7)
ALT (SGPT): 37 U/L (ref 16–61)
AST (SGOT): 40 U/L — ABNORMAL HIGH (ref 10–38)
Albumin: 4.5 g/dL (ref 3.4–5.0)
Alk. phosphatase: 45 U/L (ref 45–117)
Anion gap: 8 mmol/L (ref 3.0–18)
BUN/Creatinine ratio: 14 (ref 12–20)
BUN: 14 MG/DL (ref 7.0–18)
Bilirubin, total: 0.3 MG/DL (ref 0.2–1.0)
CO2: 27 mmol/L (ref 21–32)
Calcium: 8.6 MG/DL (ref 8.5–10.1)
Chloride: 105 mmol/L (ref 100–111)
Creatinine: 1 MG/DL (ref 0.6–1.3)
GFR est AA: >60 mL/min/{1.73_m2} (ref >60)
GFR est non-AA: >60 mL/min/{1.73_m2} (ref >60)
Globulin: 4.3 g/dL — ABNORMAL HIGH (ref 2.0–4.0)
Glucose: 80 mg/dL (ref 74–99)
Potassium: 3.8 mmol/L (ref 3.5–5.5)
Protein, total: 8.8 g/dL — ABNORMAL HIGH (ref 6.4–8.2)
Sodium: 140 mmol/L (ref 136–145)

## 2018-03-01 LAB — CBC WITH AUTOMATED DIFF
ABS. BASOPHILS: 0 10*3/uL (ref 0.0–0.06)
ABS. EOSINOPHILS: 0.1 10*3/uL (ref 0.0–0.4)
ABS. LYMPHOCYTES: 4.8 10*3/uL — ABNORMAL HIGH (ref 0.8–3.5)
ABS. MONOCYTES: 0.7 10*3/uL (ref 0–1.0)
ABS. NEUTROPHILS: 9 10*3/uL — ABNORMAL HIGH (ref 1.8–8.0)
BASOPHILS: 0 % (ref 0–3)
EOSINOPHILS: 1 % (ref 0–5)
HCT: 44.2 % (ref 36.0–48.0)
HGB: 15.5 g/dL (ref 13.0–16.0)
LYMPHOCYTES: 33 % (ref 20–51)
MCH: 31 PG (ref 24.0–34.0)
MCHC: 35.1 g/dL (ref 31.0–37.0)
MCV: 88.4 FL (ref 74.0–97.0)
MONOCYTES: 5 % (ref 2–9)
MPV: 9.7 FL (ref 9.2–11.8)
NEUTROPHILS: 61 % (ref 42–75)
PLATELET COMMENTS: ADEQUATE
PLATELET: 276 10*3/uL (ref 135–420)
RBC: 5 M/uL (ref 4.70–5.50)
RDW: 13.9 % (ref 11.6–14.5)
WBC: 14.6 10*3/uL — ABNORMAL HIGH (ref 4.6–13.2)

## 2018-03-01 LAB — LIPASE
Lipase: 112 U/L (ref 73–393)
Lipase: 112 U/L (ref 73–393)

## 2018-03-01 LAB — MAGNESIUM
Magnesium: 3 mg/dL — ABNORMAL HIGH (ref 1.6–2.6)
Magnesium: 3 mg/dL — ABNORMAL HIGH (ref 1.6–2.6)

## 2018-03-01 LAB — CBC WITH AUTO DIFFERENTIAL
Basophils %: 0 % (ref 0–3)
Basophils Absolute: 0 10*3/uL (ref 0.0–0.06)
Eosinophils %: 1 % (ref 0–5)
Eosinophils Absolute: 0.1 10*3/uL (ref 0.0–0.4)
Hematocrit: 44.2 % (ref 36.0–48.0)
Hemoglobin: 15.5 g/dL (ref 13.0–16.0)
Lymphocytes %: 33 % (ref 20–51)
Lymphocytes Absolute: 4.8 10*3/uL — ABNORMAL HIGH (ref 0.8–3.5)
MCH: 31 PG (ref 24.0–34.0)
MCHC: 35.1 g/dL (ref 31.0–37.0)
MCV: 88.4 FL (ref 74.0–97.0)
MPV: 9.7 FL (ref 9.2–11.8)
Monocytes %: 5 % (ref 2–9)
Monocytes Absolute: 0.7 10*3/uL (ref 0–1.0)
Neutrophils %: 61 % (ref 42–75)
Neutrophils Absolute: 9 10*3/uL — ABNORMAL HIGH (ref 1.8–8.0)
Platelet Comment: ADEQUATE
Platelets: 276 10*3/uL (ref 135–420)
RBC: 5 M/uL (ref 4.70–5.50)
RDW: 13.9 % (ref 11.6–14.5)
WBC: 14.6 10*3/uL — ABNORMAL HIGH (ref 4.6–13.2)

## 2018-03-01 LAB — COMPREHENSIVE METABOLIC PANEL
ALT: 37 U/L (ref 16–61)
AST: 40 U/L — ABNORMAL HIGH (ref 10–38)
Albumin/Globulin Ratio: 1 (ref 0.8–1.7)
Albumin: 4.5 g/dL (ref 3.4–5.0)
Alkaline Phosphatase: 45 U/L (ref 45–117)
Anion Gap: 8 mmol/L (ref 3.0–18)
BUN: 14 MG/DL (ref 7.0–18)
Bun/Cre Ratio: 14 (ref 12–20)
CO2: 27 mmol/L (ref 21–32)
Calcium: 8.6 MG/DL (ref 8.5–10.1)
Chloride: 105 mmol/L (ref 100–111)
Creatinine: 1 MG/DL (ref 0.6–1.3)
EGFR IF NonAfrican American: >60 mL/min/{1.73_m2} (ref >60)
GFR African American: >60 mL/min/{1.73_m2} (ref >60)
Globulin: 4.3 g/dL — ABNORMAL HIGH (ref 2.0–4.0)
Glucose: 80 mg/dL (ref 74–99)
Potassium: 3.8 mmol/L (ref 3.5–5.5)
Sodium: 140 mmol/L (ref 136–145)
Total Bilirubin: 0.3 MG/DL (ref 0.2–1.0)
Total Protein: 8.8 g/dL — ABNORMAL HIGH (ref 6.4–8.2)

## 2018-03-01 MED ORDER — SODIUM CHLORIDE 0.9% BOLUS IV
0.9 % | Freq: Once | INTRAVENOUS | Status: AC
Start: 2018-03-01 — End: 2018-03-01
  Administered 2018-03-01: 03:00:00 via INTRAVENOUS

## 2018-03-01 MED ORDER — SODIUM CHLORIDE 0.9 % IV
3300 unit- 150 mcg/10 mL | Freq: Once | INTRAVENOUS | Status: AC
Start: 2018-03-01 — End: 2018-03-01
  Administered 2018-03-01: 04:00:00 via INTRAVENOUS

## 2018-03-01 MED FILL — SODIUM CHLORIDE 0.9 % IV: INTRAVENOUS | Qty: 1000

## 2018-03-01 NOTE — ED Notes (Signed)
Patient laying with eyes closed,  Respirations even and unlabored

## 2018-03-01 NOTE — ED Notes (Signed)
Discharge instructions reviewed with pt Patient voiced understanding.  Pt ambulated out of er

## 2018-03-01 NOTE — ED Notes (Signed)
Patient ambulated to bathroom.

## 2018-03-01 NOTE — ED Notes (Signed)
Discharge instructions reviewed with pt.  Pt voiced understanding.  Pt ambulated out of er

## 2018-03-01 NOTE — ED Notes (Signed)
Discharge instructions reviewed with pt.  Patient voiced understanding.  Pt ambulated out of er

## 2018-03-01 NOTE — ED Notes (Signed)
Patient ambulated to bathroom

## 2018-06-04 ENCOUNTER — Encounter: Payer: Self-pay | Admitting: Emergency Medicine

## 2018-06-04 ENCOUNTER — Emergency Department
Admission: EM | Admit: 2018-06-04 | Discharge: 2018-06-04 | Disposition: A | Discharge status: Home / Self Care | Payer: Self-pay | Attending: Emergency Medicine | Admitting: Emergency Medicine

## 2018-06-04 ENCOUNTER — Other Ambulatory Visit: Payer: Self-pay

## 2018-06-04 DIAGNOSIS — K0381 Cracked tooth: Secondary | ICD-10-CM | POA: Insufficient documentation

## 2018-06-04 DIAGNOSIS — Z87891 Personal history of nicotine dependence: Secondary | ICD-10-CM | POA: Insufficient documentation

## 2018-06-04 DIAGNOSIS — K0889 Other specified disorders of teeth and supporting structures: Secondary | ICD-10-CM

## 2018-06-04 DIAGNOSIS — K029 Dental caries, unspecified: Secondary | ICD-10-CM | POA: Insufficient documentation

## 2018-06-04 MED ORDER — HYDROCODONE-ACETAMINOPHEN 5-325 MG PO TABS: 1.0000 | ORAL_TABLET | Freq: Four times a day (QID) | ORAL | 0 refills | Status: AC | PRN

## 2018-06-04 MED ORDER — AMOXICILLIN 500 MG PO TABS: 500.0000 mg | ORAL_TABLET | Freq: Three times a day (TID) | ORAL | 0 refills | Status: DC

## 2018-06-04 MED ORDER — HYDROCODONE-ACETAMINOPHEN 5-325 MG PO TABS: 1.0000 | ORAL_TABLET | Freq: Four times a day (QID) | ORAL | 0 refills | Status: DC | PRN

## 2018-06-04 NOTE — ED Notes (Signed)
See triage note  Presents with dental pin  States he has possible abscess to left lower gumline

## 2018-06-04 NOTE — ED Provider Notes (Signed)
[facility] Emergency Department Provider Note ____________________________________________  Time seen: Approximately 1:54 PM  I have reviewed the triage vital signs and the nursing notes.   HISTORY  Chief Complaint Dental Pain   HPI [name] is a 33 y.o. male who presents to the emergency department for treatment and evaluation of dental pain.  Patient states that symptoms started about 2 days ago and has not improved with ibuprofen.  He has a long history of dental caries and has not been able to see a dentist due to lack of insurance.  He denies fever.   History reviewed. No pertinent past medical history.  There are no active problems to display for this patient.   History reviewed. No pertinent surgical history.  Prior to Admission medications   Medication Sig Start Date End Date Taking? Authorizing Provider  amoxicillin (AMOXIL) 500 MG tablet Take 1 tablet (500 mg total) by mouth 3 (three) times daily. 06/04/18   Gwendy Boeder, Rulon Eisenmenger B, FNP  HYDROcodone-acetaminophen (NORCO/VICODIN) 5-325 MG tablet Take 1 tablet by mouth every 6 (six) hours as needed for up to 3 days for severe pain. 06/04/18 06/07/18  Chinita Pester, FNP    Allergies Patient has no known allergies.  No family history on file.  Social History Social History   Tobacco Use  . Smoking status: Former Smoker    Packs/day: 0.50    Types: Cigarettes  . Smokeless tobacco: Never Used  Substance Use Topics  . Alcohol use: No  . Drug use: Yes    Types: Marijuana    Review of Systems Constitutional: Negative for fever or recent illness. ENT: Positive for dental pain. Musculoskeletal: Negative for trismus of the jaw.  Skin: Negative for wound or lesion. ____________________________________________   PHYSICAL EXAM:  VITAL SIGNS: ED Triage Vitals  Enc Vitals Group     BP 06/04/18 1332 130/81     Pulse Rate 06/04/18 1332 72     Resp 06/04/18 1332 18     Temp 06/04/18  1332 98.4 F (36.9 C)     Temp Source 06/04/18 1332 Oral     SpO2 06/04/18 1332 100 %     Weight 06/04/18 1325 195 lb (88.5 kg)     Height 06/04/18 1325 6\' 1"  (1.854 m)     Head Circumference --      Peak Flow --      Pain Score 06/04/18 1324 8     Pain Loc --      Pain Edu? --      Excl. in GC? --     Constitutional: Alert and oriented. Well appearing and in no acute distress. Eyes: Conjunctiva are clear without discharge or drainage. Mouth/Throat: Airway is patent. Periodontal Exam    Hematological/Lymphatic/Immunilogical: no palpable anterior cervical nodes. Respiratory: Respirations even and unlabored. Musculoskeletal: Full ROM of the jaw. Neurologic: Awake, alert, oriented.  Skin: No facial edema or erythema. Psychiatric: Affect and behavior intact.  ____________________________________________   LABS (all labs ordered are listed, but only abnormal results are displayed)  Labs Reviewed - No data to display ____________________________________________   RADIOLOGY  Not indicated. ____________________________________________   PROCEDURES  Procedure(s) performed:   Procedures  Critical Care performed: No ____________________________________________   INITIAL IMPRESSION / ASSESSMENT AND PLAN / ED COURSE  [name] is a 33 y.o. male who presents to the emergency department for treatment and evaluation of dental pain.  Widespread poor oral hygiene with multiple dental caries and partial dental fractures noted  on exam.  No obvious abscess.  He will be treated with amoxicillin and Norco for the next few days.  He was encouraged to call and schedule an appointment and was given a list of dental clinics.  He was encouraged to return to the emergency department for symptoms that change or worsen if he is unable to schedule an appointment with a dentist.  Pertinent labs & imaging results that were available during my care of the patient were reviewed by me  and considered in my medical decision making (see chart for details).  ____________________________________________   FINAL CLINICAL IMPRESSION(S) / ED DIAGNOSES  Final diagnoses:  Pain, dental    New Prescriptions   AMOXICILLIN (AMOXIL) 500 MG TABLET    Take 1 tablet (500 mg total) by mouth 3 (three) times daily.   HYDROCODONE-ACETAMINOPHEN (NORCO/VICODIN) 5-325 MG TABLET    Take 1 tablet by mouth every 6 (six) hours as needed for up to 3 days for severe pain.    If controlled substance prescribed during this visit, 12 month history viewed on the [facility] prior to issuing an initial prescription for Schedule II or III opiod.  Note:  This document was prepared using Dragon voice recognition software and may include unintentional dictation errors.   Chinita Pesterriplett, Wyatte Dames B, FNP 06/04/18 1528    Nita SickleVeronese, [place], MD 06/05/18 [phone]

## 2018-06-04 NOTE — ED Triage Notes (Signed)
Pt reports pain to tooth on left lower jaw for awhile that has gotten worse.

## 2018-06-04 NOTE — Discharge Instructions (Signed)
Please call and schedule a dental appointment as soon as possible. You will need to be seen within the next 14 days. Return to the emergency department for symptoms that change or worsen if you're unable to schedule an appointment.  OPTIONS FOR DENTAL FOLLOW UP CARE  [place] Department of Health and Human Services - Local Safety Net Dental Clinics http://www.ncdhhs.gov/dph/oralhealth/services/safetynetclinics.htm   [facility] ([phone])  [facility] ([phone])  [facility] ([phone])  [facility] ([phone])  [facility] ([phone]) This clinic caters to the indigent population and is on a lottery system. Location: [facility], [address], [place] Clinic Hours: Wednesdays from 6pm - 9pm, patients seen by a lottery system. For dates, call or go to www.med.[facility].edu/[facility]/patients/Dental-[facility] Services: Cleanings, fillings and simple extractions. Payment Options: DENTAL WORK IS FREE OF CHARGE. Bring proof of income or support. Best way to get seen: Arrive at 5:15 pm - this is a lottery, NOT first come/first serve, so arriving earlier will not increase your chances of being seen.     [facility] [phone] Select option 1 for emergencies   Location: [facility], [address], [place] Clinic Hours: No walk-ins accepted - call the day before to schedule an appointment. Check in times are 9:30 am and 1:30 pm. Services: Simple extractions, temporary fillings, pulpectomy/pulp debridement, uncomplicated abscess drainage. Payment Options: PAYMENT IS DUE AT THE TIME OF SERVICE.  Fee is usually $100-200, additional surgical procedures (e.g. abscess drainage) may be extra. Cash, checks, Visa/MasterCard accepted.  Can file Medicaid if patient is covered for dental - patient should call case worker to check. No  discount for [facility] patients. Best way to get seen: MUST call the day before and get onto the schedule. Can usually be seen the next 1-2 days. No walk-ins accepted.     [facility] [phone]   Location: [facility], [address], [place] Clinic Hours: M, W, Th, F 8am or 1:30pm, Tues 9a or 1:30 - first come/first served. Services: Simple extractions, temporary fillings, uncomplicated abscess drainage.  You do not need to be an [place] resident. Payment Options: PAYMENT IS DUE AT THE TIME OF SERVICE. Dental insurance, otherwise sliding scale - bring proof of income or support. Depending on income and treatment needed, cost is usually $50-200. Best way to get seen: Arrive early as it is first come/first served.     [facility] [phone]   Location: [address] Clinic Hours: Mon-Thu 8a-5p Services: Most basic dental services including extractions and fillings. Payment Options: PAYMENT IS DUE AT THE TIME OF SERVICE. Sliding scale, up to 50% off - bring proof if income or support. Medicaid with dental option accepted. Best way to get seen: Call to schedule an appointment, can usually be seen within 2 weeks OR they will try to see walk-ins - show up at 8a or 2p (you may have to wait).     [facility] [phone] [place] RESIDENTS ONLY   Location: [facility], [address], [place], [place] [address] Clinic Hours: By appointment only. Monday - Thursday 8am-5pm, Friday 8am-12pm Services: Cleanings, fillings, extractions. Payment Options: PAYMENT IS DUE AT THE TIME OF SERVICE. Cash, Visa or MasterCard. Sliding scale - $30 minimum per service. Best way to get seen: Come in to office, complete packet and make an appointment -   need proof of income or support monies for each household member and proof of [place]  residence. Usually takes about a month to get in.     [facility] [phone]   Location: [address] [place] Clinic Hours: Walk-in Urgent Care Dental Services are offered Monday-Friday mornings only. The numbers of emergencies accepted daily is limited to the number of providers available. Maximum 15 - Mondays, Wednesdays & Thursdays Maximum 10 - Tuesdays & Fridays Services: You do not need to be a [place] resident to be seen for a dental emergency. Emergencies are defined as pain, swelling, abnormal bleeding, or dental trauma. Walkins will receive x-rays if needed. NOTE: Dental cleaning is not an emergency. Payment Options: PAYMENT IS DUE AT THE TIME OF SERVICE. Minimum co-pay is $40.00 for uninsured patients. Minimum co-pay is $3.00 for Medicaid with dental coverage. Dental Insurance is accepted and must be presented at time of visit. Medicare does not cover dental. Forms of payment: Cash, credit card, checks. Best way to get seen: If not previously registered with the clinic, walk-in dental registration begins at 7:15 am and is on a first come/first serve basis. If previously registered with the clinic, call to make an appointment.     [facility] [phone] [place] RESIDENTS ONLY   Location: [address], [place], [place] Clinic Hours: Mon-Thu 10a-2p Services: Extractions only! Payment Options: FREE (donations accepted) - bring proof of income or support Best way to get seen: Call and schedule an appointment OR come at 8am on the 1st Monday of every month (except for holidays) when it is first come/first served.     [facility] [phone]   Location: [address], [place] Clinic Hours: Friday mornings Services, Payment Options, Best way to get seen: Call for info  

## 2018-07-20 ENCOUNTER — Emergency Department
Admission: EM | Admit: 2018-07-20 | Discharge: 2018-07-20 | Disposition: A | Discharge status: Home / Self Care | Payer: Self-pay | Attending: Emergency Medicine | Admitting: Emergency Medicine

## 2018-07-20 ENCOUNTER — Encounter: Payer: Self-pay | Admitting: Emergency Medicine

## 2018-07-20 ENCOUNTER — Other Ambulatory Visit: Payer: Self-pay

## 2018-07-20 DIAGNOSIS — K0889 Other specified disorders of teeth and supporting structures: Secondary | ICD-10-CM | POA: Insufficient documentation

## 2018-07-20 MED ORDER — AMOXICILLIN 500 MG PO CAPS: 500.0000 mg | ORAL_CAPSULE | Freq: Three times a day (TID) | ORAL | 0 refills | Status: DC

## 2018-07-20 MED ORDER — TRAMADOL HCL 50 MG PO TABS: 50.0000 mg | ORAL_TABLET | Freq: Four times a day (QID) | ORAL | 0 refills | Status: DC | PRN

## 2018-07-20 MED ORDER — LIDOCAINE VISCOUS HCL 2 % MT SOLN
15.0000 mL | Freq: Once | OROMUCOSAL | Status: AC
  Administered 2018-07-20: 15 mL via OROMUCOSAL
  Filled 2018-07-20: qty 15

## 2018-07-20 NOTE — ED Notes (Signed)
See triage note  Presents with dental pain  States he has a broken tooth and thinks it may be abscessed

## 2018-07-20 NOTE — ED Triage Notes (Signed)
Pt reports toothache pain to bottom of his mouth on both sides; pain x 1 weeks; pt talking in complete coherent sentences; in no acute distress

## 2018-07-20 NOTE — ED Provider Notes (Signed)
[facility] Emergency Department Provider Note   ____________________________________________   First MD Initiated Contact with Patient 07/20/18 [phone]     (approximate)  I have reviewed the triage vital signs and the nursing notes.   HISTORY  Chief Complaint Dental Pain    HPI [name] is a 33 y.o. male patient presents emergency department complaint of dental pain.  Patient has history of poor oral hygiene with multiple caries.  Patient seen this facility 2 months ago for same complaint has not seen the dentist due to lack of funds and insurance.  Patient rates pain as a 9/10.  Patient scribed pain is "achy".  Patient denies fever associated with this complaint.         History reviewed. No pertinent past medical history.  There are no active problems to display for this patient.   History reviewed. No pertinent surgical history.  Prior to Admission medications   Medication Sig Start Date End Date Taking? Authorizing Provider  amoxicillin (AMOXIL) 500 MG capsule Take 1 capsule (500 mg total) by mouth 3 (three) times daily. 07/20/18   Joni ReiningSmith, Drema Eddington K, PA-C  traMADol (ULTRAM) 50 MG tablet Take 1 tablet (50 mg total) by mouth every 6 (six) hours as needed. 07/20/18 07/20/19  Joni ReiningSmith, Zetha Kuhar K, PA-C    Allergies Patient has no known allergies.  History reviewed. No pertinent family history.  Social History Social History   Tobacco Use  . Smoking status: Current Every Day Smoker    Packs/day: 0.25    Types: Cigarettes  . Smokeless tobacco: Never Used  Substance Use Topics  . Alcohol use: No  . Drug use: Not Currently    Types: Marijuana    Comment: quit 2 years ago    Review of Systems Constitutional: No fever/chills Eyes: No visual changes. ENT: No sore throat.  Dental pain. Cardiovascular: Denies chest pain. Respiratory: Denies shortness of breath. Gastrointestinal: No abdominal pain.  No nausea, no vomiting.  No diarrhea.  No  constipation. Genitourinary: Negative for dysuria. Musculoskeletal: Negative for back pain. Skin: Negative for rash. Neurological: Negative for headaches, focal weakness or numbness.   ____________________________________________   PHYSICAL EXAM:  VITAL SIGNS: ED Triage Vitals [07/20/18 0658]  Enc Vitals Group     BP (!) 132/103     Pulse Rate 72     Resp 17     Temp 98.4 F (36.9 C)     Temp Source Oral     SpO2 100 %     Weight 184 lb 15.5 oz (83.9 kg)     Height 6\' 1"  (1.854 m)     Head Circumference      Peak Flow      Pain Score 9     Pain Loc      Pain Edu?      Excl. in GC?    Constitutional: Alert and oriented. Well appearing and in no acute distress. Mouth/Throat: Mucous membranes are moist.  Oropharynx non-erythematous.  Multiple fractures and caries.  No obvious abscess.  Patient has full range of motion of the jaw. Neck: No stridor.  Hematological/Lymphatic/Immunilogical: No cervical lymphadenopathy. Cardiovascular: Normal rate, regular rhythm. Grossly normal heart sounds.  Good peripheral circulation. Respiratory: Normal respiratory effort.  No retractions. Lungs CTAB. Neurologic:  Normal speech and language. No gross focal neurologic deficits are appreciated. No gait instability. Skin:  Skin is warm, dry and intact. No rash noted. Psychiatric: Mood and affect are normal. Speech and behavior are normal.  ____________________________________________   LABS (all labs ordered are listed, but only abnormal results are displayed)  Labs Reviewed - No data to display ____________________________________________  EKG   ____________________________________________  RADIOLOGY  ED MD interpretation:    Official radiology report(s): No results found.  ____________________________________________   PROCEDURES  Procedure(s) performed (including Critical Care):  Procedures   ____________________________________________   INITIAL IMPRESSION /  ASSESSMENT AND PLAN / ED COURSE  As part of my medical decision making, I reviewed the following data within the [place] was evaluated in Emergency Department on 07/20/2018 for the symptoms described in the history of present illness. He was evaluated in the context of the global COVID-19 pandemic, which necessitated consideration that the patient might be at risk for infection with the SARS-CoV-2 virus that causes COVID-19. Institutional protocols and algorithms that pertain to the evaluation of patients at risk for COVID-19 are in a state of rapid change based on information released by regulatory bodies including the CDC and federal and state organizations. These policies and algorithms were followed during the patient's care in the ED.        Patient presents with neck pain secondary to poor dental hygiene.  Patient given its dentist for follow-up care.      ____________________________________________   FINAL CLINICAL IMPRESSION(S) / ED DIAGNOSES  Final diagnoses:  Pain, dental     ED Discharge Orders         Ordered    amoxicillin (AMOXIL) 500 MG capsule  3 times daily     07/20/18 0731    traMADol (ULTRAM) 50 MG tablet  Every 6 hours PRN     07/20/18 0731           Note:  This document was prepared using Dragon voice recognition software and may include unintentional dictation errors.    Sable Feil, PA-C 07/20/18 0737    Harvest Dark, MD 07/20/18 1444

## 2018-07-20 NOTE — Discharge Instructions (Addendum)
OPTIONS FOR DENTAL FOLLOW UP CARE ° °[place] Department [employer] and Human Services - Local Safety Net Dental Clinics °http://www.ncdhhs.gov/dph/oralhealth/services/safetynetclinics.htm °  °[facility] (336-562-3123) ° °[facility] [place] (919-933-9087) ° °[facility] (919-663-1744 ext [phone]) ° °[facility] ([phone]) ° °[facility] ([phone]) °This clinic caters to the indigent population and is on a lottery system. °Location: °[facility], [address], [place] °Clinic Hours: °Wednesdays from 6pm - 9pm, patients seen by a lottery system. °For dates, call or go to [email or web address].[facility].edu/[facility]/patients/Dental-[facility] °Services: °Cleanings, fillings and simple extractions. °Payment Options: °DENTAL WORK IS FREE OF CHARGE. Bring proof of income or support. °Best way to get seen: °Arrive at 5:15 pm - this is a lottery, NOT first come/first serve, so arriving earlier will not increase your chances of being seen. °  °  °[facility] °919-537-3737 °Select option 1 for emergencies °  °Location: °[facility], [address], [place] °Clinic Hours: °No walk-ins accepted - call the day before to schedule an appointment. °Check in times are 9:30 am and 1:30 pm. °Services: °Simple extractions, temporary fillings, pulpectomy/pulp debridement, uncomplicated abscess drainage. °Payment Options: °PAYMENT IS DUE AT THE TIME OF SERVICE.  Fee is usually $100-200, additional surgical procedures (e.g. abscess drainage) may be extra. °Cash, checks, Visa/MasterCard accepted.  Can file Medicaid if patient is covered for dental - patient should call case worker to check. °No discount for [facility] patients. °Best way to get seen: °MUST call the day before and get onto the schedule. Can usually be seen the next 1-2 days. No walk-ins accepted. °  °  °[facility] °[phone] °   °Location: °[facility], [address], [place] °Clinic Hours: °M, W, Th, F 8am or 1:30pm, Tues 9a or 1:30 - first come/first served. °Services: °Simple extractions, temporary fillings, uncomplicated abscess drainage.  You do not need to be an [place] resident. °Payment Options: °PAYMENT IS DUE AT THE TIME OF SERVICE. °Dental insurance, otherwise sliding scale - bring proof of income or support. °Depending on income and treatment needed, cost is usually $50-200. °Best way to get seen: °Arrive early as it is first come/first served. °  °  °[facility] °[phone] °  °Location: °[address] °Clinic Hours: °Mon-Thu 8a-5p °Services: °Most basic dental services including extractions and fillings. °Payment Options: °PAYMENT IS DUE AT THE TIME OF SERVICE. °Sliding scale, up to 50% off - bring proof if income or support. °Medicaid with dental option accepted. °Best way to get seen: °Call to schedule an appointment, can usually be seen within 2 weeks OR they will try to see walk-ins - show up at 8a or 2p (you may have to wait). °  °  °[facility] °919-245-2435 °[place] RESIDENTS ONLY °  °Location: °[facility], [address], [place], [place] [address] °Clinic Hours: By appointment only. °Monday - Thursday 8am-5pm, Friday 8am-12pm °Services: Cleanings, fillings, extractions. °Payment Options: °PAYMENT IS DUE AT THE TIME OF SERVICE. °Cash, Visa or MasterCard. Sliding scale - $30 minimum per service. °Best way to get seen: °Come in to office, complete packet and make an appointment - need proof of income °or support monies for each household member and proof of [place] residence. °Usually takes about a month to get in. °  °  °[facility] °[phone] °  °Location: °[address]   [place] °Clinic Hours: Walk-in Urgent Care Dental Services are offered Monday-Friday  mornings only. °The numbers of emergencies accepted daily is limited to the number of °providers available. °Maximum 15 - Mondays, Wednesdays & Thursdays °Maximum 10 - Tuesdays & Fridays °Services: °You do not need to be a [place] resident to be seen for a dental emergency. °Emergencies are defined as pain, swelling, abnormal bleeding, or dental trauma. Walkins will receive x-rays if needed. °NOTE: Dental cleaning is not an emergency. °Payment Options: °PAYMENT IS DUE AT THE TIME OF SERVICE. °Minimum co-pay is $40.00 for uninsured patients. °Minimum co-pay is $3.00 for Medicaid with dental coverage. °Dental Insurance is accepted and must be presented at time of visit. °Medicare does not cover dental. °Forms of payment: Cash, credit card, checks. °Best way to get seen: °If not previously registered with the clinic, walk-in dental registration begins at 7:15 am and is on a first come/first serve basis. °If previously registered with the clinic, call to make an appointment. °  °  °[facility] °919-776-4359 °[place] RESIDENTS ONLY °  °Location: °[address], [place], [place] °Clinic Hours: °Mon-Thu 10a-2p °Services: Extractions only! °Payment Options: °FREE (donations accepted) - bring proof of income or support °Best way to get seen: °Call and schedule an appointment OR come at 8am on the 1st Monday of every month (except for holidays) when it is first come/first served. °  °  °[facility] °[phone] °  °Location: °[address], [place] °Clinic Hours: °Friday mornings °Services, Payment Options, Best way to get seen: °Call for info °

## 2018-07-21 ENCOUNTER — Emergency Department: Payer: Self-pay

## 2018-07-21 ENCOUNTER — Other Ambulatory Visit: Payer: Self-pay

## 2018-07-21 ENCOUNTER — Emergency Department
Admission: EM | Admit: 2018-07-21 | Discharge: 2018-07-21 | Disposition: A | Discharge status: Home / Self Care | Payer: Self-pay | Attending: Student in an Organized Health Care Education/Training Program | Admitting: Student in an Organized Health Care Education/Training Program

## 2018-07-21 ENCOUNTER — Encounter: Payer: Self-pay | Admitting: Emergency Medicine

## 2018-07-21 DIAGNOSIS — K047 Periapical abscess without sinus: Secondary | ICD-10-CM | POA: Insufficient documentation

## 2018-07-21 DIAGNOSIS — Z79899 Other long term (current) drug therapy: Secondary | ICD-10-CM | POA: Insufficient documentation

## 2018-07-21 DIAGNOSIS — F17210 Nicotine dependence, cigarettes, uncomplicated: Secondary | ICD-10-CM | POA: Insufficient documentation

## 2018-07-21 LAB — BASIC METABOLIC PANEL
Anion gap: 8 (ref 5–15)
BUN: 10 mg/dL (ref 6–20)
CO2: 28 mmol/L (ref 22–32)
Calcium: 9.2 mg/dL (ref 8.9–10.3)
Chloride: 103 mmol/L (ref 98–111)
Creatinine, Ser: 0.84 mg/dL (ref 0.61–1.24)
GFR calc Af Amer: >60 mL/min (ref >60)
GFR calc non Af Amer: >60 mL/min (ref >60)
Glucose, Bld: 109 mg/dL — ABNORMAL HIGH (ref 70–99)
Potassium: 4.5 mmol/L (ref 3.5–5.1)
Sodium: 139 mmol/L (ref 135–145)

## 2018-07-21 LAB — CBC WITH DIFFERENTIAL/PLATELET
Abs Immature Granulocytes: 0.04 10*3/uL (ref 0.00–0.07)
Basophils Absolute: 0 10*3/uL (ref 0.0–0.1)
Basophils Relative: 0 %
Eosinophils Absolute: 0.5 10*3/uL (ref 0.0–0.5)
Eosinophils Relative: 4 %
HCT: 45 % (ref 39.0–52.0)
Hemoglobin: 14.6 g/dL (ref 13.0–17.0)
Immature Granulocytes: 0 %
Lymphocytes Relative: 21 %
Lymphs Abs: 3.2 10*3/uL (ref 0.7–4.0)
MCH: 29.4 pg (ref 26.0–34.0)
MCHC: 32.4 g/dL (ref 30.0–36.0)
MCV: 90.7 fL (ref 80.0–100.0)
Monocytes Absolute: 1.1 10*3/uL — ABNORMAL HIGH (ref 0.1–1.0)
Monocytes Relative: 7 %
Neutro Abs: 10.4 10*3/uL — ABNORMAL HIGH (ref 1.7–7.7)
Neutrophils Relative %: 68 %
Platelets: 300 10*3/uL (ref 150–400)
RBC: 4.96 MIL/uL (ref 4.22–5.81)
RDW: 13.8 % (ref 11.5–15.5)
WBC: 15.3 10*3/uL — ABNORMAL HIGH (ref 4.0–10.5)
nRBC: 0 % (ref 0.0–0.2)

## 2018-07-21 MED ORDER — MORPHINE SULFATE (PF) 4 MG/ML IV SOLN
4.0000 mg | Freq: Once | INTRAVENOUS | Status: AC
  Administered 2018-07-21: 4 mg via INTRAVENOUS
  Filled 2018-07-21: qty 1

## 2018-07-21 MED ORDER — METHYLPREDNISOLONE SODIUM SUCC 125 MG IJ SOLR
125.0000 mg | Freq: Once | INTRAMUSCULAR | Status: AC
  Administered 2018-07-21: 125 mg via INTRAVENOUS
  Filled 2018-07-21: qty 2

## 2018-07-21 MED ORDER — ONDANSETRON HCL 4 MG/2ML IJ SOLN
4.0000 mg | Freq: Once | INTRAMUSCULAR | Status: AC
  Administered 2018-07-21: 10:00:00 4 mg via INTRAVENOUS

## 2018-07-21 MED ORDER — IOHEXOL 300 MG/ML  SOLN
75.0000 mL | Freq: Once | INTRAMUSCULAR | Status: AC | PRN
  Administered 2018-07-21: 75 mL via INTRAVENOUS
  Filled 2018-07-21: qty 75

## 2018-07-21 MED ORDER — OXYCODONE-ACETAMINOPHEN 7.5-325 MG PO TABS: 1.0000 | ORAL_TABLET | Freq: Four times a day (QID) | ORAL | 0 refills | Status: DC | PRN

## 2018-07-21 MED ORDER — CLINDAMYCIN HCL 300 MG PO CAPS: 300.0000 mg | ORAL_CAPSULE | Freq: Three times a day (TID) | ORAL | 0 refills | Status: AC

## 2018-07-21 MED ORDER — METHYLPREDNISOLONE 4 MG PO TBPK: ORAL_TABLET | ORAL | 0 refills | Status: DC

## 2018-07-21 MED ORDER — ONDANSETRON HCL 4 MG/2ML IJ SOLN
INTRAMUSCULAR | Status: AC
  Filled 2018-07-21: qty 2

## 2018-07-21 MED ORDER — MORPHINE SULFATE (PF) 4 MG/ML IV SOLN
4.0000 mg | Freq: Once | INTRAVENOUS | Status: AC
  Administered 2018-07-21: 10:00:00 4 mg via INTRAVENOUS

## 2018-07-21 MED ORDER — MORPHINE SULFATE (PF) 4 MG/ML IV SOLN
INTRAVENOUS | Status: AC
  Filled 2018-07-21: qty 1

## 2018-07-21 MED ORDER — CLINDAMYCIN PHOSPHATE 600 MG/50ML IV SOLN
600.0000 mg | Freq: Once | INTRAVENOUS | Status: AC
  Administered 2018-07-21: 11:00:00 600 mg via INTRAVENOUS
  Filled 2018-07-21: qty 50

## 2018-07-21 NOTE — ED Triage Notes (Signed)
Pt reports toothache to right lower jaw. Pt states was seen yesterday and given an abx and a medication for pain, tramadol but states the pain meds are not working.

## 2018-07-21 NOTE — ED Notes (Signed)
See triage note  Presents with swelling to right side of face    Was seen for dental abscess yesterday  Has been taking Amoxil     But pain and swelling this am

## 2018-07-21 NOTE — Discharge Instructions (Addendum)
Follow-up with dentist soon as you finished the antibiotics.

## 2018-07-21 NOTE — ED Provider Notes (Signed)
[facility] Emergency Department Provider Note   ____________________________________________   First MD Initiated Contact with Patient 07/21/18 [phone]     (approximate)  I have reviewed the triage vital signs and the nursing notes.   HISTORY  Chief Complaint Dental Pain    HPI [name] is a 33 y.o. male patient return visit for dental pain.  Patient was seen yesterday with dental pain secondary to multiple caries and poor dental hygiene.  Patient was started on amoxicillin and given tramadol.  Patient awakened this morning for increased facial edema and pain.  Patient rates pain as a 10/10.  Patient described pain is "achy".  No other palliative measure for complaint.         History reviewed. No pertinent past medical history.  There are no active problems to display for this patient.   History reviewed. No pertinent surgical history.  Prior to Admission medications   Medication Sig Start Date End Date Taking? Authorizing Provider  amoxicillin (AMOXIL) 500 MG capsule Take 1 capsule (500 mg total) by mouth 3 (three) times daily. 07/20/18   Sable Feil, PA-C  clindamycin (CLEOCIN) 300 MG capsule Take 1 capsule (300 mg total) by mouth 3 (three) times daily for 10 days. 07/21/18 07/31/18  Sable Feil, PA-C  methylPREDNISolone (MEDROL DOSEPAK) 4 MG TBPK tablet Take Tapered dose as directed 07/21/18   Sable Feil, PA-C  oxyCODONE-acetaminophen (PERCOCET) 7.5-325 MG tablet Take 1 tablet by mouth every 6 (six) hours as needed. 07/21/18   Sable Feil, PA-C  traMADol (ULTRAM) 50 MG tablet Take 1 tablet (50 mg total) by mouth every 6 (six) hours as needed. 07/20/18 07/20/19  Sable Feil, PA-C    Allergies Patient has no known allergies.  No family history on file.  Social History Social History   Tobacco Use  . Smoking status: Current Every Day Smoker    Packs/day: 0.25    Types: Cigarettes  . Smokeless tobacco: Never Used   Substance Use Topics  . Alcohol use: No  . Drug use: Not Currently    Types: Marijuana    Comment: quit 2 years ago    Review of Systems  Constitutional: No fever/chills Eyes: No visual changes. ENT: No sore throat.  Dental pain. Cardiovascular: Denies chest pain. Respiratory: Denies shortness of breath. Gastrointestinal: No abdominal pain.  No nausea, no vomiting.  No diarrhea.  No constipation. Genitourinary: Negative for dysuria. Musculoskeletal: Negative for back pain. Skin: Negative for rash. Neurological: Negative for headaches, focal weakness or numbness.   ____________________________________________   PHYSICAL EXAM:  VITAL SIGNS: ED Triage Vitals [07/21/18 0853]  Enc Vitals Group     BP      Pulse      Resp      Temp      Temp src      SpO2      Weight 185 lb (83.9 kg)     Height 6\' 1"  (1.854 m)     Head Circumference      Peak Flow      Pain Score 10     Pain Loc      Pain Edu?      Excl. in [place]?     Constitutional: Alert and oriented. Well appearing and in no acute distress. Mouth/Throat: Right lateral mandible edema.  Mucous membranes are moist.  Oropharynx non-erythematous.  Patient has widespread dental disease consist of multiple caries and fractured teeth.  Gingiva is edematous. Neck:  No stridor.   Hematological/Lymphatic/Immunilogical: No cervical lymphadenopathy. Cardiovascular: Normal rate, regular rhythm. Grossly normal heart sounds.  Good peripheral circulation. Respiratory: Normal respiratory effort.  No retractions. Lungs CTAB. Skin:  Skin is warm, dry and intact. No rash noted. Psychiatric: Mood and affect are normal. Speech and behavior are normal.  ____________________________________________   LABS (all labs ordered are listed, but only abnormal results are displayed)  Labs Reviewed  BASIC METABOLIC PANEL - Abnormal; Notable for the following components:      Result Value   Glucose, Bld 109 (*)    All other components within  normal limits  CBC WITH DIFFERENTIAL/PLATELET - Abnormal; Notable for the following components:   WBC 15.3 (*)    Neutro Abs 10.4 (*)    Monocytes Absolute 1.1 (*)    All other components within normal limits   ____________________________________________  EKG   ____________________________________________  RADIOLOGY  ED MD interpretation:    Official radiology report(s): Ct Maxillofacial W Contrast  Result Date: 07/21/2018 CLINICAL DATA:  33 year old male with right toothache and jaw swelling since yesterday. EXAM: CT MAXILLOFACIAL WITH CONTRAST TECHNIQUE: Multidetector CT imaging of the maxillofacial structures was performed with intravenous contrast. Multiplanar CT image reconstructions were also generated. CONTRAST:  75mL OMNIPAQUE IOHEXOL 300 MG/ML  SOLN COMPARISON:  CT cervical spine and head 08/14/2016 FINDINGS: Osseous: Carious right anterior mandible molar with bulky periosteal lucency and lateral cortical breakthrough (series 3, image 54). Elongated subperiosteal abscess along the lateral body of the mandible encompassing at least 27 x 8 by 18 millimeters (AP by transverse by CC). See series 2, image 57 and series 6, image 39. Regional soft tissue swelling and inflammation. Involvement of the inferior right masticator space. Carious contralateral left mandible molar and left posterior maxillary molar. No other acute osseous abnormality identified. Orbits: Intact orbital walls. Orbits soft tissues are normal. Sinuses: Only trace scattered paranasal sinus mucosal thickening. Trace bubbly opacity in the left sphenoid. Visible tympanic cavities and mastoids are clear. Soft tissues: Abnormal right face and lower masticator space soft tissues as stated above. Reactive right level 1 lymph nodes up to 10 millimeter short axis individually. Edema in the right submandibular space. Mild mass effect on the right submandibular gland which appears to remain normal. Negative sublingual space, left  submandibular space, bilateral parotid spaces, parapharyngeal spaces, retropharyngeal space, pharynx and larynx. Level 2 lymph nodes appear within normal limits. The visible major vascular structures in the neck and at the skull base are patent, including the cavernous sinus. Limited intracranial: Negative. IMPRESSION: 1. Odontogenic right facial cellulitis. Poor right anterior mandible molar with cortical breakthrough and 2-3 cm Subperiosteal abscess along the lateral body of the mandible. See series 2, image 57. Reactive right level 1 and level 2 lymphadenopathy. 2. No other acute findings. Carious posterior left mandible dentition also. Electronically Signed   By: Odessa FlemingH  Hall M.D.   On: 07/21/2018 10:40    ____________________________________________   PROCEDURES  Procedure(s) performed (including Critical Care):  Procedures   ____________________________________________   INITIAL IMPRESSION / ASSESSMENT AND PLAN / ED COURSE  As part of my medical decision making, I reviewed the following data within the electronic MEDICAL RECORD NUMBER         [name] was evaluated in Emergency Department on 07/21/2018 for the symptoms described in the history of present illness. He was evaluated in the context of the global COVID-19 pandemic, which necessitated consideration that the patient might be at risk for infection with the SARS-CoV-2 virus  that causes COVID-19. Institutional protocols and algorithms that pertain to the evaluation of patients at risk for COVID-19 are in a state of rapid change based on information released by regulatory bodies including the CDC and federal and state organizations. These policies and algorithms were followed during the patient's care in the ED.       ____________________________________________   FINAL CLINICAL IMPRESSION(S) / ED DIAGNOSES  Final diagnoses:  Dental abscess     ED Discharge Orders         Ordered    clindamycin (CLEOCIN) 300 MG  capsule  3 times daily     07/21/18 1153    oxyCODONE-acetaminophen (PERCOCET) 7.5-325 MG tablet  Every 6 hours PRN     07/21/18 1153    methylPREDNISolone (MEDROL DOSEPAK) 4 MG TBPK tablet     07/21/18 1157           Note:  This document was prepared using Dragon voice recognition software and may include unintentional dictation errors.    Joni ReiningSmith, Primrose Oler K, PA-C 07/21/18 1158    Willy Eddyobinson, Patrick, MD 07/21/18 [phone]

## 2018-08-19 ENCOUNTER — Emergency Department (HOSPITAL_COMMUNITY)
Admission: EM | Admit: 2018-08-19 | Discharge: 2018-08-19 | Disposition: A | Discharge status: Home / Self Care | Payer: Self-pay | Attending: Emergency Medicine | Admitting: Emergency Medicine

## 2018-08-19 ENCOUNTER — Encounter (HOSPITAL_COMMUNITY): Payer: Self-pay | Admitting: Emergency Medicine

## 2018-08-19 ENCOUNTER — Other Ambulatory Visit: Payer: Self-pay

## 2018-08-19 DIAGNOSIS — F17210 Nicotine dependence, cigarettes, uncomplicated: Secondary | ICD-10-CM | POA: Insufficient documentation

## 2018-08-19 DIAGNOSIS — R109 Unspecified abdominal pain: Secondary | ICD-10-CM | POA: Insufficient documentation

## 2018-08-19 DIAGNOSIS — Z20828 Contact with and (suspected) exposure to other viral communicable diseases: Secondary | ICD-10-CM | POA: Insufficient documentation

## 2018-08-19 DIAGNOSIS — Z79899 Other long term (current) drug therapy: Secondary | ICD-10-CM | POA: Insufficient documentation

## 2018-08-19 NOTE — Discharge Instructions (Addendum)
Your blood pressure is slightly elevated at 136/92.  Please have this rechecked soon.  The remainder of your vital signs are within normal limits.  Your oxygen level is 98% on room air.  Your abdominal examination is benign at this time.  A COVID-19 virus test has been obtained.  Your results should be available within the next 48 to 72 hours.  Please make sure you use your mask, wash your hands frequently, and maintain physical distancing until you have received the results of your COVID testing.  Please see your primary physician or return to the emergency department if your stomach symptoms return.

## 2018-08-19 NOTE — ED Triage Notes (Signed)
PT states he went to work this morning and was told to come get a work note to return to work because he told them he had some abdominal pain last night but no longer has any after taking over the counter pepto. PT denies any complaints and requesting worknote to say he can return to work.

## 2018-08-19 NOTE — ED Provider Notes (Signed)
[facility] EMERGENCY DEPARTMENT Provider Note   CSN: [id number] Arrival date & time: 08/19/18  [id number]     History   Chief Complaint Chief Complaint  Patient presents with  . Letter for School/Work    HPI [name] is a 33 y.o. male.     Patient is a 33 year old male who presents to the emergency department following a bout of stomach upset.  The patient states that on yesterday he began having problems with bloating of his stomach and increased belching.  Some abdominal pain and discomfort.  Discontinued on into this morning.  The patient states he called into work and was told that he would have to have a work note to return to work.  The patient denies diarrhea.  He denies vomiting.  He is not had any fever or chills to be reported.  He denies any changes in taste.  He has not had any difficulty with his breathing.  He denies being around anyone with a known diagnosis of the COVID-19 virus.  He has not been traveling recently.  He is requesting evaluation, and a note to say he can return to work.  The history is provided by the patient.    History reviewed. No pertinent past medical history.  There are no active problems to display for this patient.   History reviewed. No pertinent surgical history.      Home Medications    Prior to Admission medications   Medication Sig Start Date End Date Taking? Authorizing Provider  amoxicillin (AMOXIL) 500 MG capsule Take 1 capsule (500 mg total) by mouth 3 (three) times daily. 07/20/18   Joni ReiningSmith, Ronald K, PA-C  methylPREDNISolone (MEDROL DOSEPAK) 4 MG TBPK tablet Take Tapered dose as directed 07/21/18   Joni ReiningSmith, Ronald K, PA-C  oxyCODONE-acetaminophen (PERCOCET) 7.5-325 MG tablet Take 1 tablet by mouth every 6 (six) hours as needed. 07/21/18   Joni ReiningSmith, Ronald K, PA-C  traMADol (ULTRAM) 50 MG tablet Take 1 tablet (50 mg total) by mouth every 6 (six) hours as needed. 07/20/18 07/20/19  Joni ReiningSmith, Ronald K, PA-C    Family History History  reviewed. No pertinent family history.  Social History Social History   Tobacco Use  . Smoking status: Current Every Day Smoker    Packs/day: 0.25    Types: Cigarettes  . Smokeless tobacco: Never Used  Substance Use Topics  . Alcohol use: No  . Drug use: Not Currently    Types: Marijuana    Comment: quit 2 years ago     Allergies   Patient has no known allergies.   Review of Systems Review of Systems  Constitutional: Negative for activity change and appetite change.  HENT: Negative for congestion, ear discharge, ear pain, facial swelling, nosebleeds, rhinorrhea, sneezing and tinnitus.   Eyes: Negative for photophobia, pain and discharge.  Respiratory: Negative for cough, choking, shortness of breath and wheezing.   Cardiovascular: Negative for chest pain, palpitations and leg swelling.  Gastrointestinal: Positive for abdominal pain, constipation and nausea. Negative for vomiting.  Genitourinary: Negative for difficulty urinating, dysuria, flank pain, frequency and hematuria.  Musculoskeletal: Negative for back pain, gait problem, myalgias and neck pain.  Skin: Negative for color change, rash and wound.  Neurological: Negative for dizziness, seizures, syncope, facial asymmetry, speech difficulty, weakness and numbness.  Hematological: Negative for adenopathy. Does not bruise/bleed easily.  Psychiatric/Behavioral: Negative for agitation, confusion, hallucinations, self-injury and suicidal ideas. The patient is not nervous/anxious.      Physical Exam Updated Vital Signs  BP (!) 136/92 (BP Location: Right Arm)   Pulse 99   Temp 98.3 F (36.8 C) (Oral)   Resp 18   Ht 6\' 1"  (1.854 m)   Wt 81.6 kg   SpO2 98%   BMI 23.75 kg/m   Physical Exam Vitals signs and nursing note reviewed.  Constitutional:      General: He is not in acute distress.    Appearance: He is well-developed.  HENT:     Head: Normocephalic and atraumatic.     Right Ear: External ear normal.     Left  Ear: External ear normal.  Eyes:     General: No scleral icterus.       Right eye: No discharge.        Left eye: No discharge.     Conjunctiva/sclera: Conjunctivae normal.  Neck:     Musculoskeletal: Neck supple.     Trachea: No tracheal deviation.  Cardiovascular:     Rate and Rhythm: Normal rate and regular rhythm.  Pulmonary:     Effort: Pulmonary effort is normal. No respiratory distress.     Breath sounds: Normal breath sounds. No stridor. No wheezing or rales.  Abdominal:     General: Bowel sounds are normal. There is no distension.     Palpations: Abdomen is soft.     Tenderness: There is no abdominal tenderness. There is no guarding or rebound.  Musculoskeletal:        General: No tenderness.  Skin:    General: Skin is warm and dry.     Findings: No rash.  Neurological:     Mental Status: He is alert.     Cranial Nerves: No cranial nerve deficit (no facial droop, extraocular movements intact, no slurred speech).     Sensory: No sensory deficit.     Motor: No abnormal muscle tone or seizure activity.     Coordination: Coordination normal.      ED Treatments / Results  Labs (all labs ordered are listed, but only abnormal results are displayed) Labs Reviewed - No data to display  EKG None  Radiology No results found.  Procedures Procedures (including critical care time)  Medications Ordered in ED Medications - No data to display   Initial Impression / Assessment and Plan / ED Course  I have reviewed the triage vital signs and the nursing notes.  Pertinent labs & imaging results that were available during my care of the patient were reviewed by me and considered in my medical decision making (see chart for details).          Final Clinical Impressions(s) / ED Diagnoses MDM  Pulse oximetry is 98% on room air.  Within normal limits by my interpretation.  The blood pressure is elevated at 136/92, otherwise the vital signs are stable.  I have asked  the patient to have this rechecked soon.  The patient's abdominal examination is benign at this time.  I will obtain a COVID-19 test as the patient's employer requests note for him to return to work.  I explained to the patient that he would need to quarantine himself until the test results return.  The patient request to have a note without a COVID test.  I explained to him that this was not in line with emergency medicine protocol.  The patient has agreed to have the COVID tests and quarantine himself until the test results has return.  COVID test sent to the lab. I discussed with the patient the need  to wash hands frequently.  Maintain physical distancing, and to use his mask.  Patient in agreement with this plan.   Final diagnoses:  Abdominal pain, unspecified abdominal location    ED Discharge Orders    None       Lily Kocher, PA-C 08/19/18 [place], [place], [place] 08/22/18 [phone]

## 2018-08-20 LAB — SARS CORONAVIRUS 2 (TAT 6-24 HRS): SARS Coronavirus 2: NEGATIVE

## 2018-12-22 ENCOUNTER — Emergency Department: Payer: Self-pay

## 2018-12-22 ENCOUNTER — Emergency Department
Admission: EM | Admit: 2018-12-22 | Discharge: 2018-12-22 | Disposition: A | Discharge status: Home / Self Care | Payer: Self-pay | Attending: Emergency Medicine | Admitting: Emergency Medicine

## 2018-12-22 ENCOUNTER — Other Ambulatory Visit: Payer: Self-pay

## 2018-12-22 DIAGNOSIS — X500XXA Overexertion from strenuous movement or load, initial encounter: Secondary | ICD-10-CM | POA: Insufficient documentation

## 2018-12-22 DIAGNOSIS — S39012A Strain of muscle, fascia and tendon of lower back, initial encounter: Secondary | ICD-10-CM | POA: Insufficient documentation

## 2018-12-22 DIAGNOSIS — Y9389 Activity, other specified: Secondary | ICD-10-CM | POA: Insufficient documentation

## 2018-12-22 DIAGNOSIS — Y9269 Other specified industrial and construction area as the place of occurrence of the external cause: Secondary | ICD-10-CM | POA: Insufficient documentation

## 2018-12-22 DIAGNOSIS — Y990 Civilian activity done for income or pay: Secondary | ICD-10-CM | POA: Insufficient documentation

## 2018-12-22 DIAGNOSIS — F17210 Nicotine dependence, cigarettes, uncomplicated: Secondary | ICD-10-CM | POA: Insufficient documentation

## 2018-12-22 LAB — URINALYSIS, COMPLETE (UACMP) WITH MICROSCOPIC
Bacteria, UA: NONE SEEN
Bilirubin Urine: NEGATIVE
Glucose, UA: NEGATIVE mg/dL
Hgb urine dipstick: NEGATIVE
Ketones, ur: NEGATIVE mg/dL
Leukocytes,Ua: NEGATIVE
Nitrite: NEGATIVE
Protein, ur: NEGATIVE mg/dL
Specific Gravity, Urine: 1.024 (ref 1.005–1.030)
Squamous Epithelial / HPF: NONE SEEN (ref 0–5)
pH: 5 (ref 5.0–8.0)

## 2018-12-22 MED ORDER — CYCLOBENZAPRINE HCL 5 MG PO TABS: 5.0000 mg | ORAL_TABLET | Freq: Three times a day (TID) | ORAL | 0 refills | Status: DC | PRN

## 2018-12-22 MED ORDER — HYDROCODONE-ACETAMINOPHEN 5-325 MG PO TABS: 1.0000 | ORAL_TABLET | Freq: Three times a day (TID) | ORAL | 0 refills | Status: AC | PRN

## 2018-12-22 MED ORDER — KETOROLAC TROMETHAMINE 10 MG PO TABS: 10.0000 mg | ORAL_TABLET | Freq: Three times a day (TID) | ORAL | 0 refills | Status: DC

## 2018-12-22 MED ORDER — LIDOCAINE 5 % EX PTCH: 1.0000 | MEDICATED_PATCH | CUTANEOUS | 0 refills | Status: AC

## 2018-12-22 MED ORDER — KETOROLAC TROMETHAMINE 30 MG/ML IJ SOLN
30.0000 mg | Freq: Once | INTRAMUSCULAR | Status: AC
  Administered 2018-12-22: 30 mg via INTRAMUSCULAR
  Filled 2018-12-22: qty 1

## 2018-12-22 MED ORDER — ORPHENADRINE CITRATE 30 MG/ML IJ SOLN
60.0000 mg | INTRAMUSCULAR | Status: AC
  Administered 2018-12-22: 60 mg via INTRAMUSCULAR
  Filled 2018-12-22: qty 2

## 2018-12-22 NOTE — ED Triage Notes (Signed)
Attempted to call employer [employer] at [phone], no answer.  WC testing required only upon request

## 2018-12-22 NOTE — Discharge Instructions (Addendum)
Your exam and XR are negative for any fracture or dislocation to the spine. You are likely experiencing muscle strain and spasms. Take the prescription meds as directed. Apply ice and/or moist heat to reduce symptoms. Follow-up with [facility] for ongoing symptoms.

## 2018-12-22 NOTE — ED Provider Notes (Signed)
[facility] Emergency Department Provider Note ____________________________________________  Time seen: 1851  I have reviewed the triage vital signs and the nursing notes.  HISTORY  Chief Complaint  Back Pain  HPI [name] is a 33 y.o. male presents to the ED for ongoing right-sided low back pain following a mechanical injury.  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  He describes on Friday, he was lifting a mixing multiple bags of quarter which were a 90 pounds.  He describes an immediate sharp pain as he lifted one of the bags, and turned to put it on the mixer.  He reported sudden sharp pain to the right posterior thoracolumbar region.  He has been trying to self treat at home with left over muscle relaxants and anti-inflammatories but he presents now for evaluation of a work-related injury.  He denies any bladder or bowel incontinence, foot drop, or saddle anesthesias.  He also denies any dysuria, hematuria, or retention.  History reviewed. No pertinent past medical history.  There are no active problems to display for this patient.  History reviewed. No pertinent surgical history.  Prior to Admission medications   Medication Sig Start Date End Date Taking? Authorizing Provider  cyclobenzaprine (FLEXERIL) 5 MG tablet Take 1 tablet (5 mg total) by mouth 3 (three) times daily as needed. 12/22/18   Armon Orvis, Charlesetta Ivory, PA-C  HYDROcodone-acetaminophen (NORCO) 5-325 MG tablet Take 1 tablet by mouth 3 (three) times daily as needed for up to 2 days. 12/22/18 12/24/18  Helon Wisinski, Charlesetta Ivory, PA-C  ketorolac (TORADOL) 10 MG tablet Take 1 tablet (10 mg total) by mouth every 8 (eight) hours. 12/22/18   Kieran Nachtigal, Charlesetta Ivory, PA-C  lidocaine (LIDODERM) 5 % Place 1 patch onto the skin daily for 5 days. Remove & Discard patch after 12 hours of wear each day. 12/22/18 12/27/18  Bobbi Yount, Charlesetta Ivory, PA-C     Allergies Patient has no known allergies.  History reviewed. No pertinent family history.  Social History Social History   Tobacco Use  . Smoking status: Current Every Day Smoker    Packs/day: 0.25    Types: Cigarettes  . Smokeless tobacco: Never Used  Substance Use Topics  . Alcohol use: No  . Drug use: Not Currently    Types: Marijuana    Comment: quit 2 years ago    Review of Systems  Constitutional: Negative for fever. Cardiovascular: Negative for chest pain. Respiratory: Negative for shortness of breath. Gastrointestinal: Negative for abdominal pain, vomiting and diarrhea. Genitourinary: Negative for dysuria. Musculoskeletal: Positive for back pain. Skin: Negative for rash. Neurological: Negative for headaches, focal weakness or numbness. ____________________________________________  PHYSICAL EXAM:  VITAL SIGNS: ED Triage Vitals  Enc Vitals Group     BP 12/22/18 1800 135/76     Pulse Rate 12/22/18 1800 87     Resp 12/22/18 1800 17     Temp 12/22/18 1802 98.3 F (36.8 C)     Temp Source 12/22/18 1800 Oral     SpO2 12/22/18 1800 98 %     Weight 12/22/18 1802 180 lb (81.6 kg)     Height 12/22/18 1802 6\' 1"  (1.854 m)     Head Circumference --      Peak Flow --      Pain Score 12/22/18 1802 5     Pain Loc --      Pain Edu? --  Excl. in [place]? --     Constitutional: Alert and oriented. Well appearing and in no distress. Head: Normocephalic and atraumatic. Eyes: Conjunctivae are normal. Normal extraocular movements Cardiovascular: Normal rate, regular rhythm. Normal distal pulses. Respiratory: Normal respiratory effort. No wheezes/rales/rhonchi. Gastrointestinal: Soft and nontender. No distention. No CVA tenderness Musculoskeletal: Normal spinal alignment without midline tenderness, spasm, vomiting, or step-off.  Patient tender to palpation to the right thoracolumbar region.  Nontender with normal range of motion in all extremities.  Neurologic:  Cranial nerves II through XII grossly intact.  Normal gait without ataxia. Normal speech and language. No gross focal neurologic deficits are appreciated. Skin:  Skin is warm, dry and intact. No rash noted. Psychiatric: Mood and affect are normal. Patient exhibits appropriate insight and judgment. ____________________________________________   LABS (pertinent positives/negatives) Labs Reviewed  URINALYSIS, COMPLETE (UACMP) WITH MICROSCOPIC - Abnormal; Notable for the following components:      Result Value   Color, Urine YELLOW (*)    APPearance HAZY (*)    All other components within normal limits  ____________________________________________   RADIOLOGY  DG Lumbar Spine negative ____________________________________________  PROCEDURES  Toradol 30 mg IM Norflex 60 mg IM Procedures ____________________________________________  INITIAL IMPRESSION / ASSESSMENT AND PLAN / ED COURSE  Patient with ED evaluation of low back pain following mechanical injury.  Patient's exam is overall benign and reassuring at this time.  X-rays negative for any acute findings.  He has been treated empirically with muscle relaxants and antiinflammatories in the ED.  Prescriptions for the same will be provided.  He will also be given a prescription for Lidoderm patches as well as a small prescription #6 of hydrocodone.  Follow-up with [place] urgent care for this work-related injury.  Work note is provided for 1 day as requested.  [name] was evaluated in Emergency Department on 12/22/2018 for the symptoms described in the history of present illness. He was evaluated in the context of the global COVID-19 pandemic, which necessitated consideration that the patient might be at risk for infection with the SARS-CoV-2 virus that causes COVID-19. Institutional protocols and algorithms that pertain to the evaluation of patients at risk for COVID-19 are in a state of rapid change based on information released  by regulatory bodies including the CDC and federal and state organizations. These policies and algorithms were followed during the patient's care in the ED.  I reviewed the patient's prescription history over the last 12 months in the multi-state controlled substances database(s) that includes [place], [place], [place], [place], [place], [place], [place], [place], [place], [place], [place], [place], [place], and [place].  Results were notable for no current RX.  ____________________________________________  FINAL CLINICAL IMPRESSION(S) / ED DIAGNOSES  Final diagnoses:  Strain of lumbar region, initial encounter      Melvenia Needles, PA-C 12/22/18 2036    Vanessa [place], MD 12/22/18 2044

## 2018-12-22 NOTE — ED Notes (Signed)
See triage note  States he developed pain to right upper back and lateral rib area on Friday after lifting heavy bags  Then later that today he fell  Now having pain to lower back ambulates  slowly to treatment room

## 2018-12-22 NOTE — ED Notes (Signed)
Patient declined discharge vital signs. 

## 2018-12-22 NOTE — ED Triage Notes (Signed)
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

## 2019-01-12 ENCOUNTER — Emergency Department
Admission: EM | Admit: 2019-01-12 | Discharge: 2019-01-12 | Disposition: A | Discharge status: Home / Self Care | Payer: Self-pay | Attending: Emergency Medicine | Admitting: Emergency Medicine

## 2019-01-12 ENCOUNTER — Encounter: Payer: Self-pay | Admitting: Emergency Medicine

## 2019-01-12 ENCOUNTER — Other Ambulatory Visit: Payer: Self-pay

## 2019-01-12 ENCOUNTER — Emergency Department: Payer: Self-pay

## 2019-01-12 DIAGNOSIS — F1210 Cannabis abuse, uncomplicated: Secondary | ICD-10-CM | POA: Insufficient documentation

## 2019-01-12 DIAGNOSIS — W208XXA Other cause of strike by thrown, projected or falling object, initial encounter: Secondary | ICD-10-CM | POA: Insufficient documentation

## 2019-01-12 DIAGNOSIS — Y939 Activity, unspecified: Secondary | ICD-10-CM | POA: Insufficient documentation

## 2019-01-12 DIAGNOSIS — S0990XA Unspecified injury of head, initial encounter: Secondary | ICD-10-CM

## 2019-01-12 DIAGNOSIS — Y9269 Other specified industrial and construction area as the place of occurrence of the external cause: Secondary | ICD-10-CM | POA: Insufficient documentation

## 2019-01-12 DIAGNOSIS — Y990 Civilian activity done for income or pay: Secondary | ICD-10-CM | POA: Insufficient documentation

## 2019-01-12 DIAGNOSIS — F17210 Nicotine dependence, cigarettes, uncomplicated: Secondary | ICD-10-CM | POA: Insufficient documentation

## 2019-01-12 DIAGNOSIS — S0101XA Laceration without foreign body of scalp, initial encounter: Secondary | ICD-10-CM | POA: Insufficient documentation

## 2019-01-12 MED ORDER — BACITRACIN-NEOMYCIN-POLYMYXIN 400-5-5000 EX OINT
TOPICAL_OINTMENT | Freq: Once | CUTANEOUS | Status: AC
  Filled 2019-01-12: qty 1

## 2019-01-12 MED ORDER — KETOROLAC TROMETHAMINE 30 MG/ML IJ SOLN
30.0000 mg | Freq: Once | INTRAMUSCULAR | Status: DC
  Filled 2019-01-12: qty 1

## 2019-01-12 MED ORDER — TRAMADOL HCL 50 MG PO TABS
50.0000 mg | ORAL_TABLET | Freq: Once | ORAL | Status: AC
  Administered 2019-01-12: 50 mg via ORAL
  Filled 2019-01-12: qty 1

## 2019-01-12 MED ORDER — NAPROXEN 500 MG PO TABS: 500.0000 mg | ORAL_TABLET | Freq: Two times a day (BID) | ORAL | 0 refills | Status: DC

## 2019-01-12 NOTE — ED Triage Notes (Signed)
Pt presents to ED via POV with c/o a brick falling off of scaffolding approx 25-30 ft and hitting him in the head. Pt denies LOC at this time states, "It takes a lot to knock me out". Pt with small laceration to back of his head. Pt states unsure if he is filing workers comp, just "depends on how much it's going to cost me". Pt A&O, speaking in complete sentences at this time.   Pt denies C-spine tenderness with palpation at this time.

## 2019-01-12 NOTE — Discharge Instructions (Signed)
Return to the ER for symptoms that change or worsen.  Ice to the sore areas off and on over the next few days.

## 2019-01-12 NOTE — ED Provider Notes (Signed)
[facility] Emergency Department Provider Note ____________________________________________  Time seen: Approximately 7:44 PM  I have reviewed the triage vital signs and the nursing notes.   HISTORY  Chief Complaint Head Injury   HPI [name] is a 33 y.o. male who presents to the emergency department for treatment and evaluation after head injury. While at work, a brick fell from about 30 feet above his head and hit him. No loss of consciousness. Injury occurred at about 3:00pm. He complains of headache. No alleviating measures attempted prior to arrival. No other injury or concern.   History reviewed. No pertinent past medical history.  There are no problems to display for this patient.   History reviewed. No pertinent surgical history.  Prior to Admission medications   Medication Sig Start Date End Date Taking? Authorizing Provider  cyclobenzaprine (FLEXERIL) 5 MG tablet Take 1 tablet (5 mg total) by mouth 3 (three) times daily as needed. 12/22/18   Menshew, Charlesetta IvoryJenise V Bacon, PA-C  ketorolac (TORADOL) 10 MG tablet Take 1 tablet (10 mg total) by mouth every 8 (eight) hours. 12/22/18   Menshew, Charlesetta IvoryJenise V Bacon, PA-C  naproxen (NAPROSYN) 500 MG tablet Take 1 tablet (500 mg total) by mouth 2 (two) times daily with a meal. 01/12/19   Jatasia Gundrum B, FNP    Allergies Patient has no known allergies.  No family history on file.  Social History Social History   Tobacco Use  . Smoking status: Current Every Day Smoker    Packs/day: 0.25    Types: Cigarettes  . Smokeless tobacco: Never Used  Substance Use Topics  . Alcohol use: No  . Drug use: Not Currently    Types: Marijuana    Comment: quit 2 years ago    Review of Systems Constitutional: No fever/chills. Eyes: No visual changes. ENT: No sore throat. Respiratory: Denies shortness of breath. Gastrointestinal: No abdominal pain. Musculoskeletal: Negative for neck pain. Skin: Positive for  scalp abrasion. Neurological:Positive for headache, Negative for focal weakness or numbness. No confusion or fainting. ___________________________________________   PHYSICAL EXAM:  VITAL SIGNS: ED Triage Vitals  Enc Vitals Group     BP 01/12/19 1751 126/73     Pulse Rate 01/12/19 1751 62     Resp 01/12/19 1751 18     Temp 01/12/19 1751 98.3 F (36.8 C)     Temp Source 01/12/19 1751 Oral     SpO2 01/12/19 1751 100 %     Weight 01/12/19 1749 180 lb (81.6 kg)     Height 01/12/19 1749 6\' 1"  (1.854 m)     Head Circumference --      Peak Flow --      Pain Score 01/12/19 1749 6     Pain Loc --      Pain Edu? --      Excl. in [place]? --     Constitutional: Alert and oriented. Uncomfortable appearing and in no acute distress. Eyes: Conjunctivae are normal. PERRL. EOMI without expressed pain. No evidence of papilledema on limited exam. Head: Atraumatic. Nose: No congestion/rhinnorhea. Mouth/Throat: Mucous membranes are moist. Neck: No stridor. Supple, no meningismus or focal midline tenderness.  Cardiovascular: Good peripheral circulation. Respiratory: Normal respiratory effort.  No retractions. Lungs CTAB. Gastrointestinal: Soft and nontender. No distention.  Musculoskeletal: No lower extremity tenderness. Neurologic:  Normal speech and language. No gross focal neurologic deficits are appreciated. No gait instability. Cranial nerves: 2-10 normal as tested. Cerebellar:Normal Romberg, finger-nose-finger, heel to shin, normal gait. Sensorimotor: No  aphasia, pronator drift, clonus, sensory loss or abnormal reflexes.  Skin: Superficial scalp laceration without active bleeding. Psychiatric: Mood and affect are normal. Speech and behavior are normal. Normal thought process and cognition.  ____________________________________________   LABS (all labs ordered are listed, but only abnormal results are displayed)  Labs Reviewed - No data to  display ____________________________________________  EKG  Not indicated. ____________________________________________  RADIOLOGY  CT Head Wo Contrast  Result Date: 01/12/2019 CLINICAL DATA:  Head trauma, headache EXAM: CT HEAD WITHOUT CONTRAST CT CERVICAL SPINE WITHOUT CONTRAST TECHNIQUE: Multidetector CT imaging of the head and cervical spine was performed following the standard protocol without intravenous contrast. Multiplanar CT image reconstructions of the cervical spine were also generated. COMPARISON:  None. FINDINGS: CT HEAD FINDINGS Brain: No evidence of acute infarction, hemorrhage, hydrocephalus, extra-axial collection or mass lesion/mass effect. Vascular: No hyperdense vessel or unexpected calcification. Skull: No osseous abnormality. Sinuses/Orbits: Visualized paranasal sinuses are clear. Visualized mastoid sinuses are clear. Visualized orbits demonstrate no focal abnormality. Other: None CT CERVICAL SPINE FINDINGS Alignment: No static listhesis. Loss of the normal cervical lordosis with straightening. Skull base and vertebrae: No acute fracture. No primary bone lesion or focal pathologic process. Soft tissues and spinal canal: No prevertebral fluid or swelling. No visible canal hematoma. Disc levels: Disc spaces are maintained. No foraminal or central canal stenosis. Upper chest: Lung apices are clear. Other: No fluid collection or hematoma. IMPRESSION: 1. No acute intracranial pathology. 2.  No acute osseous injury of the cervical spine. Electronically Signed   By: Kathreen Devoid   On: 01/12/2019 18:36   CT Cervical Spine Wo Contrast  Result Date: 01/12/2019 CLINICAL DATA:  Head trauma, headache EXAM: CT HEAD WITHOUT CONTRAST CT CERVICAL SPINE WITHOUT CONTRAST TECHNIQUE: Multidetector CT imaging of the head and cervical spine was performed following the standard protocol without intravenous contrast. Multiplanar CT image reconstructions of the cervical spine were also generated.  COMPARISON:  None. FINDINGS: CT HEAD FINDINGS Brain: No evidence of acute infarction, hemorrhage, hydrocephalus, extra-axial collection or mass lesion/mass effect. Vascular: No hyperdense vessel or unexpected calcification. Skull: No osseous abnormality. Sinuses/Orbits: Visualized paranasal sinuses are clear. Visualized mastoid sinuses are clear. Visualized orbits demonstrate no focal abnormality. Other: None CT CERVICAL SPINE FINDINGS Alignment: No static listhesis. Loss of the normal cervical lordosis with straightening. Skull base and vertebrae: No acute fracture. No primary bone lesion or focal pathologic process. Soft tissues and spinal canal: No prevertebral fluid or swelling. No visible canal hematoma. Disc levels: Disc spaces are maintained. No foraminal or central canal stenosis. Upper chest: Lung apices are clear. Other: No fluid collection or hematoma. IMPRESSION: 1. No acute intracranial pathology. 2.  No acute osseous injury of the cervical spine. Electronically Signed   By: Kathreen Devoid   On: 01/12/2019 18:36   ____________________________________________   PROCEDURES  Procedure(s) performed:  Procedures  Critical Care performed: None ____________________________________________   INITIAL IMPRESSION / ASSESSMENT AND PLAN / ED COURSE  33 year old male presenting to the emergency department for evaluation after being hit in the head by a brick while at work. See HPI for further details. Exam and CT of the head and cervical spine is reassuring. Will medicate for headache and plan for discharge.  Patient refused toradol injection. Wound care provided by RN. He will be discharged home with Rx for naprosyn. Patient ambulated out of the department without assistance.  Pertinent labs & imaging results that were available during my care of the patient were reviewed by me  and considered in my medical decision making (see chart for  details). ____________________________________________   FINAL CLINICAL IMPRESSION(S) / ED DIAGNOSES  Final diagnoses:  Minor head injury without loss of consciousness, initial encounter  Scalp laceration, initial encounter    ED Discharge Orders         Ordered    naproxen (NAPROSYN) 500 MG tablet  2 times daily with meals     01/12/19 2015              Chinita Pester, FNP 01/12/19 2018    Minna Antis, MD 01/12/19 2233

## 2019-01-12 NOTE — ED Notes (Signed)
Pt states brick fell from 52ft landing on the back of his head, small lac noted, bleeding controlled. No LOC.

## 2019-03-17 ENCOUNTER — Encounter: Payer: Self-pay | Admitting: Emergency Medicine

## 2019-03-17 ENCOUNTER — Emergency Department
Admission: EM | Admit: 2019-03-17 | Discharge: 2019-03-17 | Disposition: A | Discharge status: Home / Self Care | Payer: Medicaid Other | Attending: Emergency Medicine | Admitting: Emergency Medicine

## 2019-03-17 ENCOUNTER — Other Ambulatory Visit: Payer: Self-pay

## 2019-03-17 ENCOUNTER — Emergency Department: Payer: Medicaid Other

## 2019-03-17 DIAGNOSIS — J069 Acute upper respiratory infection, unspecified: Secondary | ICD-10-CM | POA: Insufficient documentation

## 2019-03-17 DIAGNOSIS — F17210 Nicotine dependence, cigarettes, uncomplicated: Secondary | ICD-10-CM | POA: Insufficient documentation

## 2019-03-17 DIAGNOSIS — Z20822 Contact with and (suspected) exposure to covid-19: Secondary | ICD-10-CM | POA: Insufficient documentation

## 2019-03-17 DIAGNOSIS — Z79899 Other long term (current) drug therapy: Secondary | ICD-10-CM | POA: Insufficient documentation

## 2019-03-17 MED ORDER — BENZONATATE 100 MG PO CAPS: 100.0000 mg | ORAL_CAPSULE | Freq: Three times a day (TID) | ORAL | 0 refills | Status: AC | PRN

## 2019-03-17 MED ORDER — ALBUTEROL SULFATE HFA 108 (90 BASE) MCG/ACT IN AERS: 2.0000 | INHALATION_SPRAY | Freq: Four times a day (QID) | RESPIRATORY_TRACT | 0 refills | Status: AC | PRN

## 2019-03-17 NOTE — ED Triage Notes (Signed)
Patient ambulatory to triage with steady gait, without difficulty or distress noted, mask in place; pt St last 3 days having chills, nonprod cough, congestion and body aches

## 2019-03-17 NOTE — ED Provider Notes (Signed)
Emergency Department Provider Note  ____________________________________________  Time seen: Approximately 10:35 PM  I have reviewed the triage vital signs and the nursing notes.   HISTORY  Chief Complaint Cough   Historian Patient     HPI [name] is a 34 y.o. male presents to the emergency department with low-grade fever, body aches and cough for the past 3 days.  Patient states that he was around someone with COVID-19 several weeks ago.  He denies chest pain, chest tightness or abdominal pain.  No emesis or diarrhea.  No recent travel.  No other alleviating measures have been attempted.   History reviewed. No pertinent past medical history.   Immunizations up to date:  Yes.     History reviewed. No pertinent past medical history.  There are no problems to display for this patient.   History reviewed. No pertinent surgical history.  Prior to Admission medications   Medication Sig Start Date End Date Taking? Authorizing Provider  albuterol (VENTOLIN HFA) 108 (90 Base) MCG/ACT inhaler Inhale 2 puffs into the lungs every 6 (six) hours as needed for wheezing or shortness of breath. 03/17/19   Lannie Fields, PA-C  benzonatate (TESSALON PERLES) 100 MG capsule Take 1 capsule (100 mg total) by mouth 3 (three) times daily as needed for up to 7 days for cough. 03/17/19 03/24/19  Lannie Fields, PA-C  cyclobenzaprine (FLEXERIL) 5 MG tablet Take 1 tablet (5 mg total) by mouth 3 (three) times daily as needed. 12/22/18   Menshew, Dannielle Karvonen, PA-C  ketorolac (TORADOL) 10 MG tablet Take 1 tablet (10 mg total) by mouth every 8 (eight) hours. 12/22/18   Menshew, Dannielle Karvonen, PA-C  naproxen (NAPROSYN) 500 MG tablet Take 1 tablet (500 mg total) by mouth 2 (two) times daily with a meal. 01/12/19   Triplett, Cari B, FNP    Allergies Patient has no known allergies.  No family history on file.  Social History Social History   Tobacco Use  . Smoking status: Current  Every Day Smoker    Packs/day: 0.25    Types: Cigarettes  . Smokeless tobacco: Never Used  Substance Use Topics  . Alcohol use: No  . Drug use: Not Currently    Types: Marijuana    Comment: quit 2 years ago      Review of Systems  Constitutional: Patient has fever.  Eyes: No visual changes. No discharge ENT: Patient has congestion.  Cardiovascular: no chest pain. Respiratory: Patient has cough.  Gastrointestinal: No abdominal pain.  No nausea, no vomiting. No diarrhea.  Genitourinary: Negative for dysuria. No hematuria Musculoskeletal: Patient has myalgias.  Skin: Negative for rash, abrasions, lacerations, ecchymosis. Neurological: Patient has headache, no focal weakness or numbness.    ____________________________________________   PHYSICAL EXAM:  VITAL SIGNS: ED Triage Vitals [03/17/19 2217]  Enc Vitals Group     BP (!) 176/86     Pulse Rate 71     Resp 20     Temp 98 F (36.7 C)     Temp Source Oral     SpO2 100 %     Weight 180 lb (81.6 kg)     Height 6\' 1"  (1.854 m)     Head Circumference      Peak Flow      Pain Score 0     Pain Loc      Pain Edu?      Excl. in [place]?      Constitutional: Alert and oriented.  Patient is lying supine. Eyes: Conjunctivae are normal. PERRL. EOMI. Head: Atraumatic. ENT:      Ears: Tympanic membranes are mildly injected with mild effusion bilaterally.       Nose: No congestion/rhinnorhea.      Mouth/Throat: Mucous membranes are moist. Posterior pharynx is mildly erythematous.  Hematological/Lymphatic/Immunilogical: No cervical lymphadenopathy.  Cardiovascular: Normal rate, regular rhythm. Normal S1 and S2.  Good peripheral circulation. Respiratory: Normal respiratory effort without tachypnea or retractions. Lungs CTAB. Good air entry to the bases with no decreased or absent breath sounds. Gastrointestinal: Bowel sounds 4 quadrants. Soft and nontender to palpation. No guarding or rigidity. No palpable masses. No distention.  No CVA tenderness. Musculoskeletal: Full range of motion to all extremities. No gross deformities appreciated. Neurologic:  Normal speech and language. No gross focal neurologic deficits are appreciated.  Skin:  Skin is warm, dry and intact. No rash noted. Psychiatric: Mood and affect are normal. Speech and behavior are normal. Patient exhibits appropriate insight and judgement.    ____________________________________________   LABS (all labs ordered are listed, but only abnormal results are displayed)  Labs Reviewed  SARS CORONAVIRUS 2 (TAT 6-24 HRS)   ____________________________________________  EKG   ____________________________________________  RADIOLOGY Geraldo Pitter, personally viewed and evaluated these images (plain radiographs) as part of my medical decision making, as well as reviewing the written report by the radiologist.  DG Chest 1 View  Result Date: 03/17/2019 CLINICAL DATA:  Cough EXAM: CHEST  1 VIEW COMPARISON:  December 17, 2016 FINDINGS: The heart size and mediastinal contours are within normal limits. Both lungs are clear. The visualized skeletal structures are unremarkable. IMPRESSION: No active disease. Electronically Signed   By: Jonna Clark M.D.   On: 03/17/2019 22:30    ____________________________________________    PROCEDURES  Procedure(s) performed:     Procedures     Medications - No data to display   ____________________________________________   INITIAL IMPRESSION / ASSESSMENT AND PLAN / ED COURSE  Pertinent labs & imaging results that were available during my care of the patient were reviewed by me and considered in my medical decision making (see chart for details).    Assessment and plan Viral URI with cough 34 year old male presents to the emergency department with cough, fever and body aches for the past 3 days.  Patient was hypertensive at triage but vital signs were otherwise reassuring.  On physical exam,  patient was resting comfortably with no adventitious lung sounds auscultated.  Differential diagnosis includes COVID-19 versus community-acquired pneumonia versus unspecified viral URI  Chest x-ray revealed no consolidations, opacities or infiltrates to suggest community-acquired pneumonia.  Patient was discharged with Tessalon Perles and albuterol inhaler.  Rest and hydration were encouraged at home.  Tylenol and ibuprofen alternating for fever and body aches were recommended.  A work note was provided.  Return precautions were given to return with new or worsening symptoms.  All patient questions were answered.   ____________________________________________  FINAL CLINICAL IMPRESSION(S) / ED DIAGNOSES  Final diagnoses:  Viral URI with cough      NEW MEDICATIONS STARTED DURING THIS VISIT:  ED Discharge Orders         Ordered    albuterol (VENTOLIN HFA) 108 (90 Base) MCG/ACT inhaler  Every 6 hours PRN     03/17/19 2240    benzonatate (TESSALON PERLES) 100 MG capsule  3 times daily PRN     03/17/19 2240  This chart was dictated using voice recognition software/Dragon. Despite best efforts to proofread, errors can occur which can change the meaning. Any change was purely unintentional.     Orvil Feil, PA-C 03/17/19 2240    Minna Antis, MD 03/17/19 [phone]

## 2019-03-17 NOTE — ED Notes (Signed)
Pt to the er for cough, fever, body aches. Pt reports SOB with walking and in the middle of the night. Pt friend was dx with Covid a few weeks ago.

## 2019-03-18 LAB — SARS CORONAVIRUS 2 (TAT 6-24 HRS): SARS Coronavirus 2: NEGATIVE

## 2019-08-10 ENCOUNTER — Emergency Department
Admission: EM | Admit: 2019-08-10 | Discharge: 2019-08-10 | Disposition: A | Discharge status: Home / Self Care | Payer: Medicaid Other | Attending: Emergency Medicine | Admitting: Emergency Medicine

## 2019-08-10 ENCOUNTER — Emergency Department: Payer: Medicaid Other

## 2019-08-10 ENCOUNTER — Encounter: Payer: Self-pay | Admitting: Emergency Medicine

## 2019-08-10 ENCOUNTER — Other Ambulatory Visit: Payer: Self-pay

## 2019-08-10 DIAGNOSIS — R05 Cough: Secondary | ICD-10-CM | POA: Insufficient documentation

## 2019-08-10 DIAGNOSIS — F17210 Nicotine dependence, cigarettes, uncomplicated: Secondary | ICD-10-CM | POA: Insufficient documentation

## 2019-08-10 DIAGNOSIS — Z20822 Contact with and (suspected) exposure to covid-19: Secondary | ICD-10-CM

## 2019-08-10 DIAGNOSIS — R519 Headache, unspecified: Secondary | ICD-10-CM | POA: Insufficient documentation

## 2019-08-10 DIAGNOSIS — R5383 Other fatigue: Secondary | ICD-10-CM | POA: Insufficient documentation

## 2019-08-10 LAB — COMPREHENSIVE METABOLIC PANEL
ALT: 22 U/L (ref 0–44)
AST: 23 U/L (ref 15–41)
Albumin: 4.1 g/dL (ref 3.5–5.0)
Alkaline Phosphatase: 38 U/L (ref 38–126)
Anion gap: 9 (ref 5–15)
BUN: 12 mg/dL (ref 6–20)
CO2: 29 mmol/L (ref 22–32)
Calcium: 9.6 mg/dL (ref 8.9–10.3)
Chloride: 97 mmol/L — ABNORMAL LOW (ref 98–111)
Creatinine, Ser: 1.02 mg/dL (ref 0.61–1.24)
GFR calc Af Amer: >60 mL/min (ref >60)
GFR calc non Af Amer: >60 mL/min (ref >60)
Glucose, Bld: 127 mg/dL — ABNORMAL HIGH (ref 70–99)
Potassium: 4.2 mmol/L (ref 3.5–5.1)
Sodium: 135 mmol/L (ref 135–145)
Total Bilirubin: 0.7 mg/dL (ref 0.3–1.2)
Total Protein: 9.2 g/dL — ABNORMAL HIGH (ref 6.5–8.1)

## 2019-08-10 LAB — CBC
HCT: 39.8 % (ref 39.0–52.0)
Hemoglobin: 12.7 g/dL — ABNORMAL LOW (ref 13.0–17.0)
MCH: 26.8 pg (ref 26.0–34.0)
MCHC: 31.9 g/dL (ref 30.0–36.0)
MCV: 84.1 fL (ref 80.0–100.0)
Platelets: 335 10*3/uL (ref 150–400)
RBC: 4.73 MIL/uL (ref 4.22–5.81)
RDW: 13.8 % (ref 11.5–15.5)
WBC: 12.3 10*3/uL — ABNORMAL HIGH (ref 4.0–10.5)
nRBC: 0 % (ref 0.0–0.2)

## 2019-08-10 LAB — CK: Total CK: 83 U/L (ref 49–397)

## 2019-08-10 LAB — TROPONIN I (HIGH SENSITIVITY): Troponin I (High Sensitivity): 3 ng/L (ref <18)

## 2019-08-10 LAB — SARS CORONAVIRUS 2 BY RT PCR (HOSPITAL ORDER, PERFORMED IN ~~LOC~~ HOSPITAL LAB): SARS Coronavirus 2: NEGATIVE

## 2019-08-10 NOTE — ED Triage Notes (Signed)
Pt c/o generalized body aching and cramping.  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  Reports feels like drinks enough.  Also c/o pain in right chest and difficulty taking deep breath.  Sx started last night when got home.

## 2019-08-10 NOTE — ED Provider Notes (Signed)
[facility] Emergency Department Provider Note   ____________________________________________    I have reviewed the triage vital signs and the nursing notes.   HISTORY  Chief Complaint cramping     HPI [name] is a 34 y.o. male who presents with complaints of fatigue, body aches, mild headache, some chills which started overnight.  He has not been vaccinated against COVID-19.  No sick contacts reported.  No nausea or vomiting or abdominal pain.  No diaphoresis.  No shortness of breath.  Mild cough.  Has not taken anything for this.  History reviewed. No pertinent past medical history.  There are no problems to display for this patient.   History reviewed. No pertinent surgical history.  Prior to Admission medications   Medication Sig Start Date End Date Taking? Authorizing Provider  albuterol (VENTOLIN HFA) 108 (90 Base) MCG/ACT inhaler Inhale 2 puffs into the lungs every 6 (six) hours as needed for wheezing or shortness of breath. 03/17/19   Orvil Feil, PA-C     Allergies Patient has no known allergies.  History reviewed. No pertinent family history.  Social History Social History   Tobacco Use   Smoking status: Current Every Day Smoker    Packs/day: 0.25    Types: Cigarettes   Smokeless tobacco: Never Used  Vaping Use   Vaping Use: Former  Substance Use Topics   Alcohol use: No   Drug use: Not Currently    Types: Marijuana    Comment: quit 2 years ago    Review of Systems  Constitutional: As above Eyes: No visual changes.  ENT: No sore throat. Cardiovascular: Denies chest pain. Respiratory: As above Gastrointestinal: No abdominal pain.  No nausea, no vomiting.   Genitourinary: Negative for dysuria. Musculoskeletal: Body aches as above Skin: Negative for rash. Neurological: Negative for headaches or weakness   ____________________________________________   PHYSICAL EXAM:  VITAL SIGNS: ED Triage  Vitals [08/10/19 0840]  Enc Vitals Group     BP (!) 152/97     Pulse Rate 85     Resp 16     Temp 99.2 F (37.3 C)     Temp Source Oral     SpO2 100 %     Weight (!) 93.9 kg (207 lb)     Height 1.854 m (6\' 1" )     Head Circumference      Peak Flow      Pain Score 1     Pain Loc      Pain Edu?      Excl. in GC?     Constitutional: Alert and oriented. No acute distress  Nose: No congestion/rhinnorhea. Mouth/Throat: Mucous membranes are moist.    Cardiovascular: Normal rate, regular rhythm.  Good peripheral circulation. Respiratory: Normal respiratory effort.  No retractions.     Musculoskeletal:   Warm and well perfused Neurologic:  Normal speech and language. No gross focal neurologic deficits are appreciated.  Skin:  Skin is warm, dry and intact. No rash noted. Psychiatric: Mood and affect are normal. Speech and behavior are normal.  ____________________________________________   LABS (all labs ordered are listed, but only abnormal results are displayed)  Labs Reviewed  CBC - Abnormal; Notable for the following components:      Result Value   WBC 12.3 (*)    Hemoglobin 12.7 (*)    All other components within normal limits  COMPREHENSIVE METABOLIC PANEL - Abnormal; Notable for the following components:   Chloride 97 (*)  Glucose, Bld 127 (*)    Total Protein 9.2 (*)    All other components within normal limits  SARS CORONAVIRUS 2 BY RT PCR (HOSPITAL ORDER, PERFORMED IN [facility])  CK  TROPONIN I (HIGH SENSITIVITY)   ____________________________________________  EKG  ED ECG REPORT I, Jene Every, the attending physician, personally viewed and interpreted this ECG.  Date: 08/10/2019  Rhythm: normal sinus rhythm QRS Axis: normal Intervals: normal ST/T Wave abnormalities: normal Narrative Interpretation: no evidence of acute ischemia  ____________________________________________  RADIOLOGY  Chest x-ray reviewed by me, clear without  infiltrate or effusion ____________________________________________   PROCEDURES  Procedure(s) performed: No  Procedures   Critical Care performed: No ____________________________________________   INITIAL IMPRESSION / ASSESSMENT AND PLAN / ED COURSE  Pertinent labs & imaging results that were available during my care of the patient were reviewed by me and considered in my medical decision making (see chart for details).  Patient well-appearing and in no acute distress, differential includes likely viral illness versus less likely rhabdo or muscle irritation  CK is normal which is reassuring.  Lab work is overall unremarkable mild elevation white blood cell count is nonspecific.  Chest x-ray is benign.  Recommend supportive care, Covid swab sent.    ____________________________________________   FINAL CLINICAL IMPRESSION(S) / ED DIAGNOSES  Final diagnoses:  Suspected COVID-19 virus infection        Note:  This document was prepared using Dragon voice recognition software and may include unintentional dictation errors.   Jene Every, MD 08/10/19 [phone]

## 2019-12-02 ENCOUNTER — Encounter: Payer: Self-pay | Admitting: Emergency Medicine

## 2019-12-02 ENCOUNTER — Emergency Department
Admission: EM | Admit: 2019-12-02 | Discharge: 2019-12-02 | Disposition: A | Discharge status: Home / Self Care | Payer: HRSA Program | Attending: Emergency Medicine | Admitting: Emergency Medicine

## 2019-12-02 ENCOUNTER — Other Ambulatory Visit: Payer: Self-pay

## 2019-12-02 DIAGNOSIS — R519 Headache, unspecified: Secondary | ICD-10-CM | POA: Insufficient documentation

## 2019-12-02 DIAGNOSIS — F17210 Nicotine dependence, cigarettes, uncomplicated: Secondary | ICD-10-CM | POA: Diagnosis not present

## 2019-12-02 DIAGNOSIS — U071 COVID-19: Secondary | ICD-10-CM | POA: Diagnosis not present

## 2019-12-02 DIAGNOSIS — R059 Cough, unspecified: Secondary | ICD-10-CM | POA: Diagnosis present

## 2019-12-02 LAB — RESP PANEL BY RT-PCR (FLU A&B, COVID) ARPGX2
Influenza A by PCR: NEGATIVE
Influenza B by PCR: NEGATIVE
SARS Coronavirus 2 by RT PCR: POSITIVE — AB

## 2019-12-02 MED ORDER — PSEUDOEPH-BROMPHEN-DM 30-2-10 MG/5ML PO SYRP
5.0000 mL | ORAL_SOLUTION | Freq: Four times a day (QID) | ORAL | 0 refills | Status: DC | PRN
Start: 2019-12-02 — End: 2020-04-27

## 2019-12-02 MED ORDER — ACETAMINOPHEN 500 MG PO TABS
500.0000 mg | ORAL_TABLET | Freq: Four times a day (QID) | ORAL | 0 refills | Status: DC | PRN
Start: 2019-12-02 — End: 2020-04-27

## 2019-12-02 NOTE — ED Provider Notes (Signed)
[name]  ____________________________________________  Time seen: Approximately 1:32 PM  I have reviewed the triage vital signs and the nursing notes.   HISTORY  Chief Complaint Cough, Nasal Congestion, and Fever    HPI [name] is a 34 y.o. male that presents to the emergency department for evaluation of low-grade fever, headache, nasal congestion, nonproductive cough, body aches for 2 days.  Patient is here with his wife with similar symptoms.  He was exposed to somebody at work with an unknown URI.  He has not received a Covid vaccination.  He does not smoke.  No shortness breath, chest pain or vomiting, abdominal pain, diarrhea.   History reviewed. No pertinent past medical history.  There are no problems to display for this patient.   History reviewed. No pertinent surgical history.  Prior to Admission medications   Medication Sig Start Date End Date Taking? Authorizing Provider  acetaminophen (TYLENOL) 500 MG tablet Take 1 tablet (500 mg total) by mouth every 6 (six) hours as needed. 12/02/19   Enid Derry, PA-C  albuterol (VENTOLIN HFA) 108 (90 Base) MCG/ACT inhaler Inhale 2 puffs into the lungs every 6 (six) hours as needed for wheezing or shortness of breath. 03/17/19   Orvil Feil, PA-C  brompheniramine-pseudoephedrine-DM 30-2-10 MG/5ML syrup Take 5 mLs by mouth 4 (four) times daily as needed. 12/02/19   Enid Derry, PA-C    Allergies Patient has no known allergies.  History reviewed. No pertinent family history.  Social History Social History   Tobacco Use  . Smoking status: Current Every Day Smoker    Packs/day: 0.25    Types: Cigarettes  . Smokeless tobacco: Never Used  Vaping Use  . Vaping Use: Former  Substance Use Topics  . Alcohol use: No  . Drug use: Not Currently    Types: Marijuana    Comment: quit 2 years ago     Review of Systems  Constitutional: Positive for  low grade fever/chills Eyes: No visual changes. No discharge. ENT: Positive for congestion and rhinorrhea. Cardiovascular: No chest pain. Respiratory: Positive for cough. No SOB. Gastrointestinal: No abdominal pain.  No nausea, no vomiting.  No diarrhea.  No constipation. Musculoskeletal: Negative for musculoskeletal pain. Skin: Negative for rash, abrasions, lacerations, ecchymosis. Neurological: Positive for headaches.   ____________________________________________   PHYSICAL EXAM:  VITAL SIGNS: ED Triage Vitals  Enc Vitals Group     BP 12/02/19 0912 127/86     Pulse Rate 12/02/19 0912 86     Resp 12/02/19 0912 (!) 22     Temp 12/02/19 0912 99.2 F (37.3 C)     Temp Source 12/02/19 0912 Oral     SpO2 12/02/19 0912 97 %     Weight 12/02/19 0913 165 lb (74.8 kg)     Height 12/02/19 0913 6' (1.829 m)     Head Circumference --      Peak Flow --      Pain Score 12/02/19 0912 6     Pain Loc --      Pain Edu? --      Excl. in GC? --      Constitutional: Alert and oriented. Well appearing and in no acute distress. Eyes: Conjunctivae are normal. PERRL. EOMI. No discharge. Head: Atraumatic. ENT: No frontal and maxillary sinus tenderness.      Ears: Tympanic membranes pearly gray with good landmarks. No discharge.      Nose: Mild congestion/rhinnorhea.      Mouth/Throat:  Mucous membranes are moist. Oropharynx non-erythematous. Tonsils not enlarged. No exudates. Uvula midline. Neck: No stridor.   Hematological/Lymphatic/Immunilogical: No cervical lymphadenopathy. Cardiovascular: Normal rate, regular rhythm.  Good peripheral circulation. Respiratory: Normal respiratory effort without tachypnea or retractions. Lungs CTAB. Good air entry to the bases with no decreased or absent breath sounds. Gastrointestinal: Bowel sounds 4 quadrants. Soft and nontender to palpation. No guarding or rigidity. No palpable masses. No distention. Musculoskeletal: Full range of motion to all  extremities. No gross deformities appreciated. Neurologic:  Normal speech and language. No gross focal neurologic deficits are appreciated.  Skin:  Skin is warm, dry and intact. No rash noted. Psychiatric: Mood and affect are normal. Speech and behavior are normal. Patient exhibits appropriate insight and judgement.   ____________________________________________   LABS (all labs ordered are listed, but only abnormal results are displayed)  Labs Reviewed  RESP PANEL BY RT-PCR (FLU A&B, COVID) ARPGX2 - Abnormal; Notable for the following components:      Result Value   SARS Coronavirus 2 by RT PCR POSITIVE (*)    All other components within normal limits   ____________________________________________  EKG   ____________________________________________  RADIOLOGY   No results found.  ____________________________________________    PROCEDURES  Procedure(s) performed:    Procedures    Medications - No data to display   ____________________________________________   INITIAL IMPRESSION / ASSESSMENT AND PLAN / ED COURSE  Pertinent labs & imaging results that were available during my care of the patient were reviewed by me and considered in my medical decision making (see chart for details).  Review of the [place] CSRS was performed in accordance of the [facility] prior to dispensing any controlled drugs.     Patient's diagnosis is consistent with Covid 19. Vital signs and exam are reassuring. Patient appears well and is staying well hydrated. Patient should alternate tylenol and ibuprofen for fever. Patient feels comfortable going home. Patient will be discharged home with prescriptions for bromfed, tylenol. Patient is to follow up with PCP as needed or otherwise directed. Patient is given ED precautions to return to the ED for any worsening or new symptoms.  [name] was evaluated in Emergency Department on 12/02/2019 for the symptoms described in the history of  present illness. He was evaluated in the context of the global COVID-19 pandemic, which necessitated consideration that the patient might be at risk for infection with the SARS-CoV-2 virus that causes COVID-19. Institutional protocols and algorithms that pertain to the evaluation of patients at risk for COVID-19 are in a state of rapid change based on information released by regulatory bodies including the CDC and federal and state organizations. These policies and algorithms were followed during the patient's care in the ED.   ____________________________________________  FINAL CLINICAL IMPRESSION(S) / ED DIAGNOSES  Final diagnoses:  COVID-19      NEW MEDICATIONS STARTED DURING THIS VISIT:  ED Discharge Orders         Ordered    brompheniramine-pseudoephedrine-DM 30-2-10 MG/5ML syrup  4 times daily PRN        12/02/19 1317    acetaminophen (TYLENOL) 500 MG tablet  Every 6 hours PRN        12/02/19 1317              This chart was dictated using voice recognition software/Dragon. Despite best efforts to proofread, errors can occur which can change the meaning. Any change was purely unintentional.    Enid Derry, PA-C 12/02/19 1451  Minna Antis, MD 12/03/19 1312

## 2019-12-02 NOTE — ED Triage Notes (Signed)
Pt presents to ED via POV with c/o URI symptoms x 2 days. Pt c/o symptoms x 2 days. Pt here with wife who is also a patient for same symptoms.

## 2019-12-02 NOTE — ED Notes (Signed)
Date and time results received: 12/02/19 1:11 PM  (use smartphrase ".now" to insert current time)  Test: COVID  Critical Value: Positive   Name of Provider Notified: Enid Derry, PA   Orders Received? Or Actions Taken?: No new orders at this time

## 2020-04-16 ENCOUNTER — Encounter (HOSPITAL_COMMUNITY): Payer: Self-pay

## 2020-04-16 ENCOUNTER — Other Ambulatory Visit: Payer: Self-pay

## 2020-04-16 ENCOUNTER — Emergency Department (HOSPITAL_COMMUNITY)
Admission: EM | Admit: 2020-04-16 | Discharge: 2020-04-16 | Disposition: A | Discharge status: Home / Self Care | Payer: Medicaid Other | Attending: Emergency Medicine | Admitting: Emergency Medicine

## 2020-04-16 DIAGNOSIS — K047 Periapical abscess without sinus: Secondary | ICD-10-CM

## 2020-04-16 DIAGNOSIS — Z87891 Personal history of nicotine dependence: Secondary | ICD-10-CM | POA: Insufficient documentation

## 2020-04-16 MED ORDER — AMOXICILLIN-POT CLAVULANATE 875-125 MG PO TABS
1.0000 | ORAL_TABLET | Freq: Once | ORAL | Status: AC
  Administered 2020-04-16: 1 via ORAL
  Filled 2020-04-16: qty 1

## 2020-04-16 MED ORDER — AMOXICILLIN-POT CLAVULANATE 875-125 MG PO TABS: 1.0000 | ORAL_TABLET | Freq: Two times a day (BID) | ORAL | 0 refills | Status: AC

## 2020-04-16 NOTE — ED Triage Notes (Signed)
Pt to er, pt c/o R facial swelling, pt states that the swelling started of Friday and has gotten worse, pt has swelling to his R orbit, pt has some broken/decayed teeth on top R, no redness noted, denies known insect bit.

## 2020-04-16 NOTE — ED Provider Notes (Signed)
[facility] EMERGENCY DEPARTMENT Provider Note   CSN: [id number] Arrival date & time: 04/16/20  1745     History Chief Complaint  Patient presents with  . Facial Swelling    [name] is a 35 y.o. male who presents with right-sided facial pain and swelling x3 days.  History of dental infections and currently has a broken tooth on the top right.  Endorses some swelling around his eye but denies pain in his eye itself, denies difficulty moving the eye, blurred vision, double vision, sensitivity to light.  She denies any fevers or chills at home.  I personally reviewed this patient's medical records.  He does not carry medical diagnoses and is not on any medications every day.  HPI     History reviewed. No pertinent past medical history.  There are no problems to display for this patient.   History reviewed. No pertinent surgical history.     History reviewed. No pertinent family history.  Social History   Tobacco Use  . Smoking status: Former Smoker    Packs/day: 0.25    Types: Cigarettes    Quit date: 01/14/2018    Years since quitting: 2.2  . Smokeless tobacco: Never Used  Vaping Use  . Vaping Use: Former  Substance Use Topics  . Alcohol use: No  . Drug use: Not Currently    Home Medications Prior to Admission medications   Medication Sig Start Date End Date Taking? Authorizing Provider  amoxicillin-clavulanate (AUGMENTIN) 875-125 MG tablet Take 1 tablet by mouth every 12 (twelve) hours for 10 days. 04/16/20 04/26/20 Yes Dahlton Hinde, Eugene Gavia, PA-C  acetaminophen (TYLENOL) 500 MG tablet Take 1 tablet (500 mg total) by mouth every 6 (six) hours as needed. Patient not taking: Reported on 04/16/2020 12/02/19   Enid Derry, PA-C  albuterol (VENTOLIN HFA) 108 (90 Base) MCG/ACT inhaler Inhale 2 puffs into the lungs every 6 (six) hours as needed for wheezing or shortness of breath. Patient not taking: Reported on 04/16/2020 03/17/19   Orvil Feil, PA-C   brompheniramine-pseudoephedrine-DM 30-2-10 MG/5ML syrup Take 5 mLs by mouth 4 (four) times daily as needed. Patient not taking: Reported on 04/16/2020 12/02/19   Enid Derry, PA-C    Allergies    Patient has no known allergies.  Review of Systems   Review of Systems  Constitutional: Negative.   HENT: Positive for dental problem and facial swelling. Negative for sore throat, trouble swallowing and voice change.   Eyes: Negative for photophobia, pain, redness, itching and visual disturbance.  Respiratory: Negative.   Cardiovascular: Negative.   Gastrointestinal: Negative.   Genitourinary: Negative.   Musculoskeletal: Negative.   Neurological: Negative.     Physical Exam Updated Vital Signs BP (!) 161/97 (BP Location: Right Arm)   Pulse (!) 104   Temp 98.7 F (37.1 C) (Oral)   Resp 14   Ht 6\' 1"  (1.854 m)   Wt 81.6 kg   SpO2 100%   BMI 23.75 kg/m   Physical Exam Vitals and nursing note reviewed.  HENT:     Head: Atraumatic.      Nose: Nose normal.     Mouth/Throat:     Mouth: Mucous membranes are moist.     Dentition: Abnormal dentition. Dental tenderness, gingival swelling and dental caries present.     Palate: No mass.     Pharynx: Oropharynx is clear. Uvula midline. No oropharyngeal exudate, posterior oropharyngeal erythema or uvula swelling.     Tonsils: No tonsillar exudate.  Comments: No evidence of oropharyngeal abscess  No sublingual or submental tenderness to palpation Eyes:     General: Lids are normal. Vision grossly intact.        Right eye: No discharge.        Left eye: No discharge.     Extraocular Movements: Extraocular movements intact.     Conjunctiva/sclera: Conjunctivae normal.     Pupils: Pupils are equal, round, and reactive to light.     Comments: NO pain with extraocular movements.  Neck:     Thyroid: No thyroid mass.     Trachea: Trachea and phonation normal.  Cardiovascular:     Rate and Rhythm: Normal rate and regular rhythm.      Pulses: Normal pulses.     Heart sounds: Normal heart sounds. No murmur heard.   Pulmonary:     Effort: Pulmonary effort is normal. No respiratory distress.     Breath sounds: Normal breath sounds. No wheezing or rales.  Chest:     Chest wall: No mass, lacerations, deformity, swelling, tenderness or crepitus.  Abdominal:     General: Bowel sounds are normal. There is no distension.     Palpations: Abdomen is soft.     Tenderness: There is no abdominal tenderness. There is no right CVA tenderness, left CVA tenderness, guarding or rebound.  Musculoskeletal:        General: No deformity.     Cervical back: Normal range of motion and neck supple. No edema, rigidity or crepitus. No pain with movement or spinous process tenderness.     Right lower leg: No edema.     Left lower leg: No edema.  Lymphadenopathy:     Cervical: No cervical adenopathy.  Skin:    General: Skin is warm and dry.     Capillary Refill: Capillary refill takes less than 2 seconds.  Neurological:     General: No focal deficit present.     Mental Status: He is alert and oriented to person, place, and time. Mental status is at baseline.  Psychiatric:        Mood and Affect: Mood normal.     ED Results / Procedures / Treatments   Labs (all labs ordered are listed, but only abnormal results are displayed) Labs Reviewed - No data to display  EKG None  Radiology No results found.  Procedures Procedures  Medications Ordered in ED Medications  amoxicillin-clavulanate (AUGMENTIN) 875-125 MG per tablet 1 tablet (1 tablet Oral Given 04/16/20 2003)    ED Course  I have reviewed the triage vital signs and the nursing notes.  Pertinent labs & imaging results that were available during my care of the patient were reviewed by me and considered in my medical decision making (see chart for details).    MDM Rules/Calculators/A&P                         35 year old male presents with 3 days of right-sided facial  swelling and pain.  Hypertensive on intake, vital signs otherwise normal.  Cardiopulmonary exam is normal, abdominal exam is benign.  HEENT exam revealed fractured tooth in the right maxilla with surrounding buccal mucosal and gingival edema and tenderness to palpation without sign of oropharyngeal abscess.  Soft tissue swelling of the cheek without crepitus or induration, no swelling within the orbit itself, no eye pain with EOMs.  Patient's presentation most consistent with periapical infection without abscess.  Will administer first dose of antibiotics in  the emergency department and discharged with course of antibiotics at home.  Recommend close dental follow-up.  Jewell voiced understanding of his medical evaluation and treatment plan.  Each of his  questions was answered to his expressed satisfaction.  Return precautions given.  Patient is stable and appropriate for discharge at this time.  This chart was dictated using voice recognition software, Dragon. Despite the best efforts of this provider to proofread and correct errors, errors may still occur which can change documentation meaning.  Final Clinical Impression(s) / ED Diagnoses Final diagnoses:  Dental abscess    Rx / DC Orders ED Discharge Orders         Ordered    amoxicillin-clavulanate (AUGMENTIN) 875-125 MG tablet  Every 12 hours        04/16/20 1959           Bailea Beed, Eugene Gavia, PA-C 04/17/20 0175    Pricilla Loveless, MD 04/19/20 [phone]

## 2020-04-16 NOTE — Discharge Instructions (Addendum)
Please take entire course of antibiotics as directed.  Continue using ibuprofen / Tylenol, as well as prescribed Orajel for pain.  You will need to follow-up with your dentist for continued management of this. Please see dental resources below. Return to the emergency department for fevers, swelling or pain under the tongue or in the neck, difficulty breathing or swallowing, pain in the right eye itself, nausea or vomiting that does not stop, or any other new or concerning symptoms.

## 2020-04-25 ENCOUNTER — Emergency Department (HOSPITAL_COMMUNITY)
Admission: EM | Admit: 2020-04-25 | Discharge: 2020-04-26 | Disposition: A | Discharge status: Home / Self Care | Payer: Self-pay | Attending: Emergency Medicine | Admitting: Emergency Medicine

## 2020-04-25 ENCOUNTER — Emergency Department (HOSPITAL_COMMUNITY): Payer: Self-pay

## 2020-04-25 ENCOUNTER — Encounter (HOSPITAL_COMMUNITY): Payer: Self-pay | Admitting: Emergency Medicine

## 2020-04-25 ENCOUNTER — Other Ambulatory Visit: Payer: Self-pay

## 2020-04-25 DIAGNOSIS — S50311A Abrasion of right elbow, initial encounter: Secondary | ICD-10-CM | POA: Insufficient documentation

## 2020-04-25 DIAGNOSIS — S42021A Displaced fracture of shaft of right clavicle, initial encounter for closed fracture: Secondary | ICD-10-CM | POA: Insufficient documentation

## 2020-04-25 DIAGNOSIS — S42022A Displaced fracture of shaft of left clavicle, initial encounter for closed fracture: Secondary | ICD-10-CM

## 2020-04-25 DIAGNOSIS — R519 Headache, unspecified: Secondary | ICD-10-CM | POA: Insufficient documentation

## 2020-04-25 DIAGNOSIS — Z87891 Personal history of nicotine dependence: Secondary | ICD-10-CM | POA: Insufficient documentation

## 2020-04-25 DIAGNOSIS — S27321A Contusion of lung, unilateral, initial encounter: Secondary | ICD-10-CM

## 2020-04-25 LAB — BASIC METABOLIC PANEL
Anion gap: 10 (ref 5–15)
BUN: 8 mg/dL (ref 6–20)
CO2: 29 mmol/L (ref 22–32)
Calcium: 8.9 mg/dL (ref 8.9–10.3)
Chloride: 98 mmol/L (ref 98–111)
Creatinine, Ser: 1.12 mg/dL (ref 0.61–1.24)
GFR, Estimated: >60 mL/min (ref >60)
Glucose, Bld: 132 mg/dL — ABNORMAL HIGH (ref 70–99)
Potassium: 3.4 mmol/L — ABNORMAL LOW (ref 3.5–5.1)
Sodium: 137 mmol/L (ref 135–145)

## 2020-04-25 LAB — CBC WITH DIFFERENTIAL/PLATELET
Abs Immature Granulocytes: 0.05 10*3/uL (ref 0.00–0.07)
Basophils Absolute: 0 10*3/uL (ref 0.0–0.1)
Basophils Relative: 0 %
Eosinophils Absolute: 0.2 10*3/uL (ref 0.0–0.5)
Eosinophils Relative: 2 %
HCT: 37.3 % — ABNORMAL LOW (ref 39.0–52.0)
Hemoglobin: 12.1 g/dL — ABNORMAL LOW (ref 13.0–17.0)
Immature Granulocytes: 1 %
Lymphocytes Relative: 22 %
Lymphs Abs: 2.5 10*3/uL (ref 0.7–4.0)
MCH: 28.5 pg (ref 26.0–34.0)
MCHC: 32.4 g/dL (ref 30.0–36.0)
MCV: 87.8 fL (ref 80.0–100.0)
Monocytes Absolute: 0.9 10*3/uL (ref 0.1–1.0)
Monocytes Relative: 8 %
Neutro Abs: 7.5 10*3/uL (ref 1.7–7.7)
Neutrophils Relative %: 67 %
Platelets: 305 10*3/uL (ref 150–400)
RBC: 4.25 MIL/uL (ref 4.22–5.81)
RDW: 13.4 % (ref 11.5–15.5)
WBC: 11.1 10*3/uL — ABNORMAL HIGH (ref 4.0–10.5)
nRBC: 0 % (ref 0.0–0.2)

## 2020-04-25 MED ORDER — IOHEXOL 300 MG/ML  SOLN
100.0000 mL | Freq: Once | INTRAMUSCULAR | Status: AC | PRN
  Administered 2020-04-25: 100 mL via INTRAVENOUS

## 2020-04-25 MED ORDER — FENTANYL CITRATE (PF) 100 MCG/2ML IJ SOLN
100.0000 ug | Freq: Once | INTRAMUSCULAR | Status: AC
  Administered 2020-04-25: 100 ug via INTRAVENOUS
  Filled 2020-04-25: qty 2

## 2020-04-25 MED ORDER — HYDROMORPHONE HCL 1 MG/ML IJ SOLN: 1.0000 mg | Freq: Once | INTRAMUSCULAR | Status: DC
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
## 2020-04-26 ENCOUNTER — Emergency Department (HOSPITAL_COMMUNITY): Payer: Self-pay

## 2020-04-26 ENCOUNTER — Other Ambulatory Visit (HOSPITAL_COMMUNITY): Payer: Self-pay

## 2020-04-26 ENCOUNTER — Telehealth: Payer: Self-pay | Admitting: Orthopedic Surgery

## 2020-04-26 MED ORDER — OXYCODONE-ACETAMINOPHEN 5-325 MG PO TABS: 1.0000 | ORAL_TABLET | ORAL | 0 refills | Status: DC | PRN

## 2020-04-26 MED ORDER — OXYCODONE-ACETAMINOPHEN 5-325 MG PO TABS: 2.0000 | ORAL_TABLET | ORAL | 0 refills | Status: DC | PRN

## 2020-04-26 NOTE — Telephone Encounter (Signed)
Let's schedule this one with Dr Hilda Lias next week, let him eval and if needed refer to Dr Dallas Schimke for surgical eval.
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
## 2020-04-26 NOTE — Telephone Encounter (Signed)
Called back to patient to schedule per Wendy's response. No voice mail; unable to leave message.
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
## 2020-04-27 ENCOUNTER — Other Ambulatory Visit: Payer: Self-pay

## 2020-04-27 ENCOUNTER — Ambulatory Visit (INDEPENDENT_AMBULATORY_CARE_PROVIDER_SITE_OTHER): Payer: Self-pay | Admitting: Orthopedic Surgery

## 2020-04-27 ENCOUNTER — Encounter: Payer: Self-pay | Admitting: Orthopedic Surgery

## 2020-04-27 VITALS — BP 153/84 | HR 84 | Ht 73.0 in | Wt 180.0 lb

## 2020-04-27 DIAGNOSIS — S42021A Displaced fracture of shaft of right clavicle, initial encounter for closed fracture: Secondary | ICD-10-CM

## 2020-04-27 MED ORDER — CYCLOBENZAPRINE HCL 10 MG PO TABS
5.0000 mg | ORAL_TABLET | Freq: Three times a day (TID) | ORAL | 1 refills | Status: DC | PRN
Start: 2020-04-27 — End: 2021-12-14

## 2020-04-27 MED ORDER — OXYCODONE HCL 5 MG PO TABS: 5.0000 mg | ORAL_TABLET | Freq: Four times a day (QID) | ORAL | 0 refills | Status: AC | PRN

## 2020-04-27 NOTE — Telephone Encounter (Signed)
Patient was reached today and scheduled with Dr Dallas Schimke.
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
## 2020-05-04 ENCOUNTER — Other Ambulatory Visit: Payer: Self-pay | Admitting: Orthopedic Surgery

## 2020-05-04 NOTE — Telephone Encounter (Signed)
Patient of Dr Dallas Schimke called for refill / aware that while Dr Dallas Schimke is out of clinic this week, that our other provider(s) review refill request: oxyCODONE (ROXICODONE) 5 MG immediate release tablet 20 tablet  [facility], [place], [employer]

## 2020-05-05 ENCOUNTER — Other Ambulatory Visit: Payer: Self-pay | Admitting: Orthopedic Surgery

## 2020-05-05 MED ORDER — HYDROCODONE-ACETAMINOPHEN 10-325 MG PO TABS: 1.0000 | ORAL_TABLET | ORAL | 0 refills | Status: AC | PRN

## 2020-05-05 MED ORDER — IBUPROFEN 800 MG PO TABS: 800.0000 mg | ORAL_TABLET | Freq: Three times a day (TID) | ORAL | 1 refills | Status: AC | PRN

## 2020-05-05 NOTE — Telephone Encounter (Signed)
Holding for you to advise. Thanks.

## 2020-05-05 NOTE — Progress Notes (Signed)
Meds ordered this encounter  Medications  . HYDROcodone-acetaminophen (NORCO) 10-325 MG tablet    Sig: Take 1 tablet by mouth every 4 (four) hours as needed.    Dispense:  42 tablet    Refill:  0  . ibuprofen (ADVIL) 800 MG tablet    Sig: Take 1 tablet (800 mg total) by mouth every 8 (eight) hours as needed.    Dispense:  90 tablet    Refill:  1   opioid reduction and taper

## 2020-05-05 NOTE — Telephone Encounter (Signed)
Dr Dallas Schimke' patient, can you advise? Thanks

## 2020-05-08 ENCOUNTER — Other Ambulatory Visit: Payer: Self-pay | Admitting: Radiology

## 2020-05-08 NOTE — Telephone Encounter (Signed)
I called the patient and he voices understanding. He will pick up the medication at his pharmacy. No other concerns.

## 2020-05-08 NOTE — Telephone Encounter (Signed)
Patient called, said he did not pickup the hydrocodone Rx last week.  Says he cannot take that much tylenol along with ibuprofen he already takes.  He is asking for refill of the oxycodone.

## 2020-05-12 ENCOUNTER — Telehealth: Payer: Self-pay | Admitting: Orthopedic Surgery

## 2020-05-12 ENCOUNTER — Encounter: Payer: Self-pay | Admitting: Orthopedic Surgery

## 2020-05-12 ENCOUNTER — Encounter: Payer: Medicaid Other | Admitting: Orthopedic Surgery

## 2020-05-12 NOTE — Telephone Encounter (Signed)
Letter sent to patient; did not attend appointment.

## 2020-05-31 ENCOUNTER — Other Ambulatory Visit: Payer: Self-pay

## 2020-05-31 ENCOUNTER — Ambulatory Visit (INDEPENDENT_AMBULATORY_CARE_PROVIDER_SITE_OTHER): Payer: Self-pay | Admitting: Orthopedic Surgery

## 2020-05-31 ENCOUNTER — Ambulatory Visit: Payer: Medicaid Other

## 2020-05-31 ENCOUNTER — Encounter: Payer: Self-pay | Admitting: Orthopedic Surgery

## 2020-05-31 VITALS — Ht 73.0 in | Wt 180.0 lb

## 2020-05-31 DIAGNOSIS — S42021D Displaced fracture of shaft of right clavicle, subsequent encounter for fracture with routine healing: Secondary | ICD-10-CM

## 2020-05-31 MED ORDER — CYCLOBENZAPRINE HCL 10 MG PO TABS
10.0000 mg | ORAL_TABLET | Freq: Two times a day (BID) | ORAL | 0 refills | Status: DC | PRN
Start: 2020-05-31 — End: 2021-12-14
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
## 2020-06-02 IMAGING — CT CT CERVICAL SPINE W/O CM
3 of 4 series · 12 of 33 positions shown, 14 images · non-contrast
Comparison: None.

CLINICAL DATA: Head trauma, headache

EXAM:
CT HEAD WITHOUT CONTRAST
CT CERVICAL SPINE WITHOUT CONTRAST
TECHNIQUE: Multidetector CT imaging of the head and cervical spine was
performed following the standard protocol without intravenous
contrast. Multiplanar CT image reconstructions of the cervical spine
were also generated.

[Series 4: sagittal bone · sagittal · 0.29mm/px · 5 of 56 slices shown, 6 images]
[im 19/56  bone]
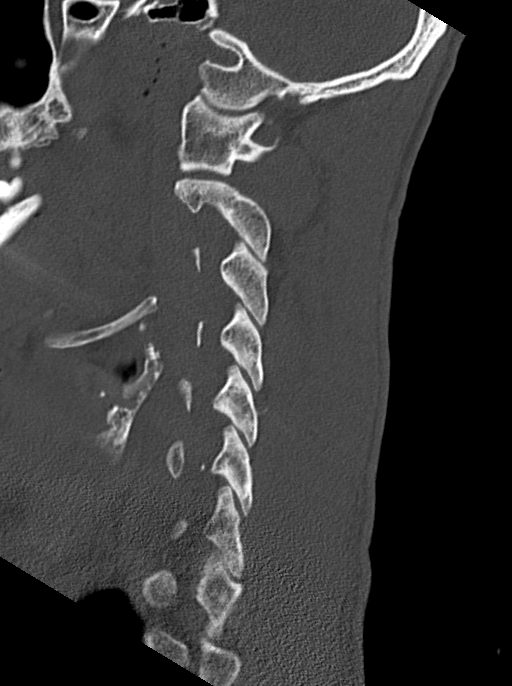
[im 23/56  bone]
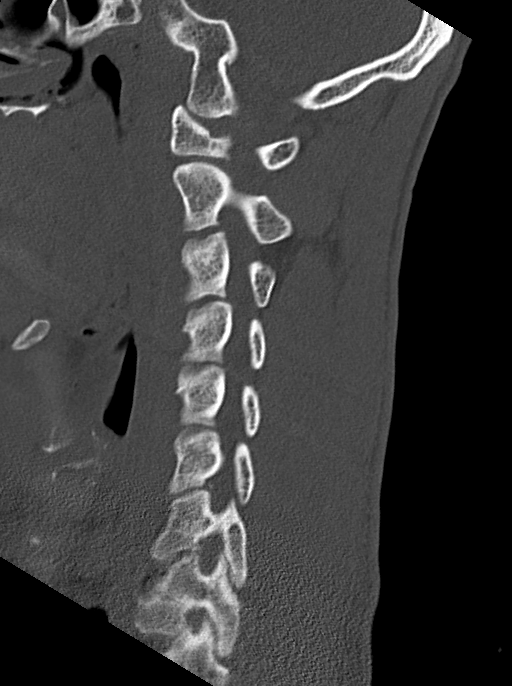
[im 28/56  soft-tissue]
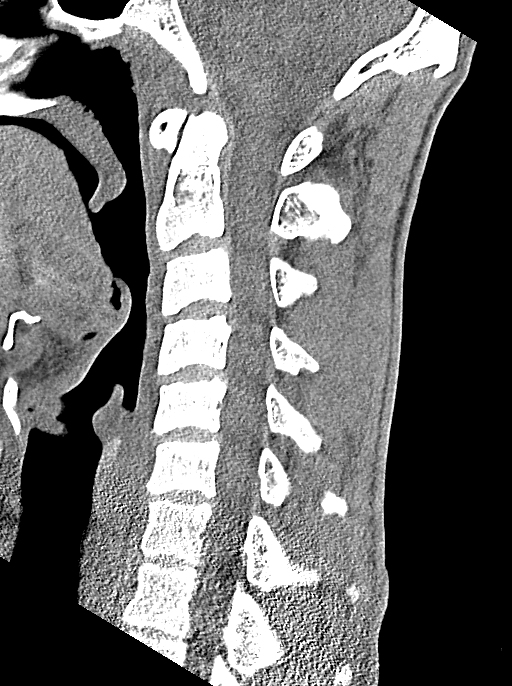
[im 28/56  bone]
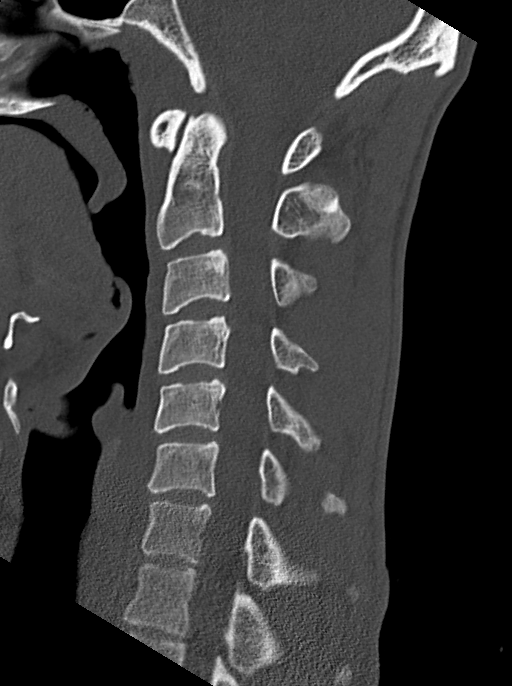
[im 33/56  bone]
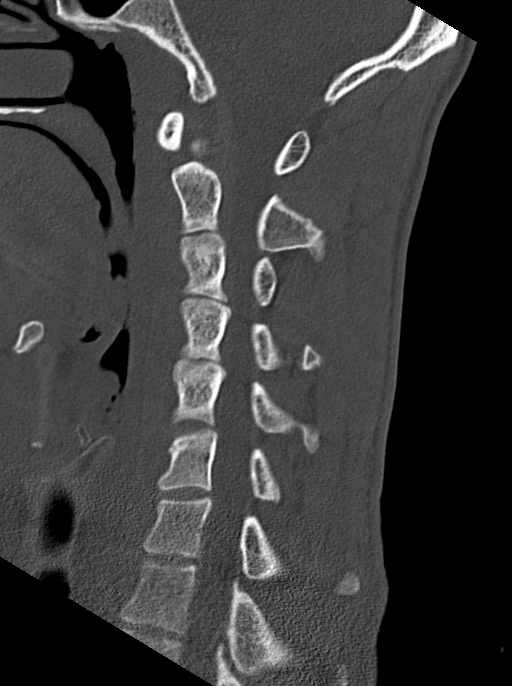
[im 37/56  bone]
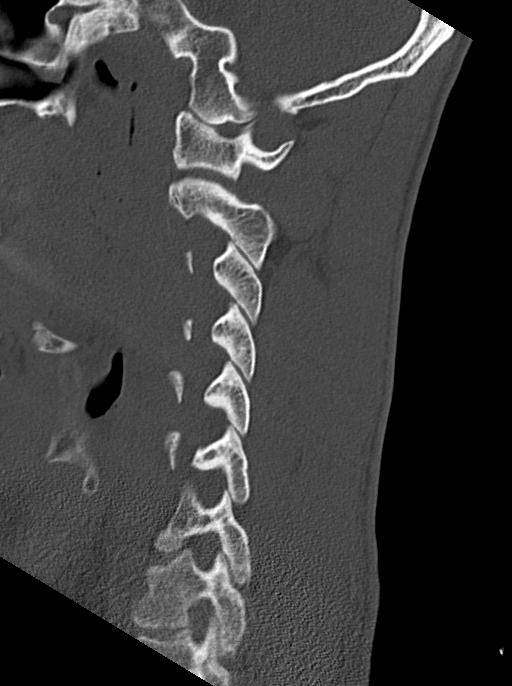

[Series 5: coronal bone · coronal · 0.22mm/px · 3 of 47 slices shown]
[im 10/47  bone]
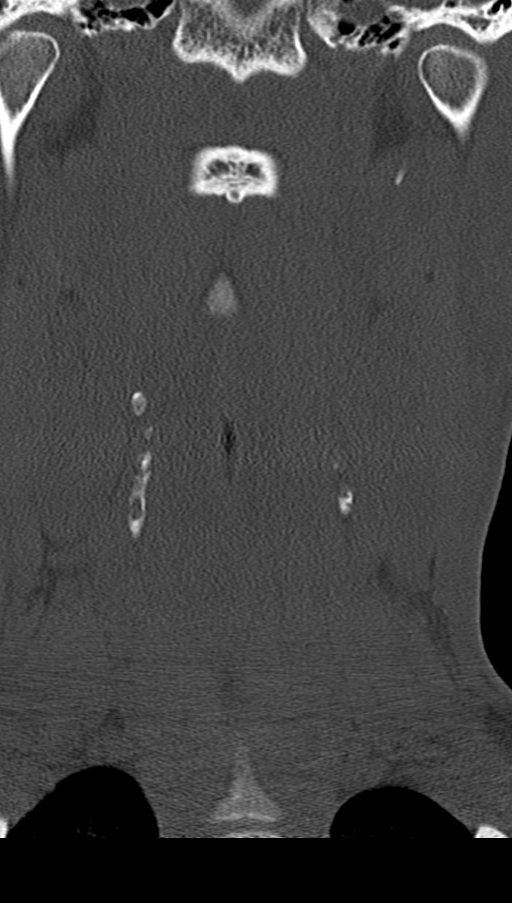
[im 19/47  bone]
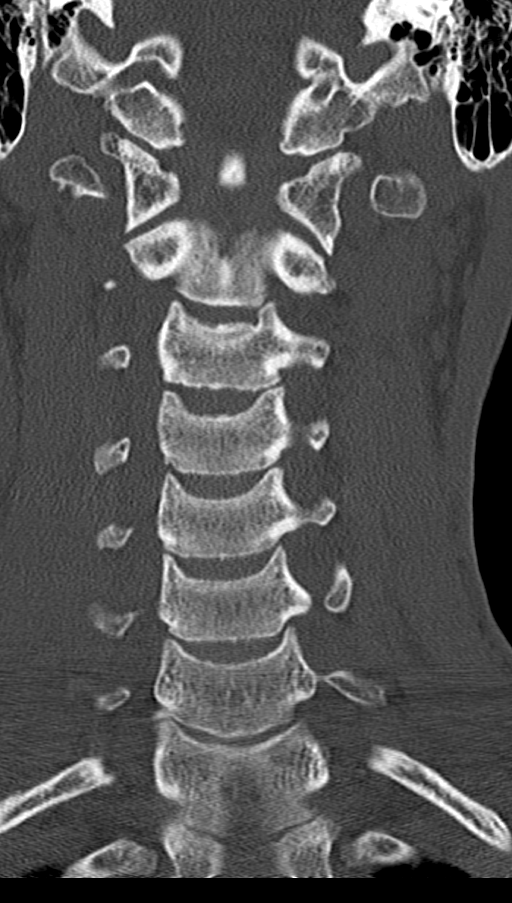
[im 28/47  bone]
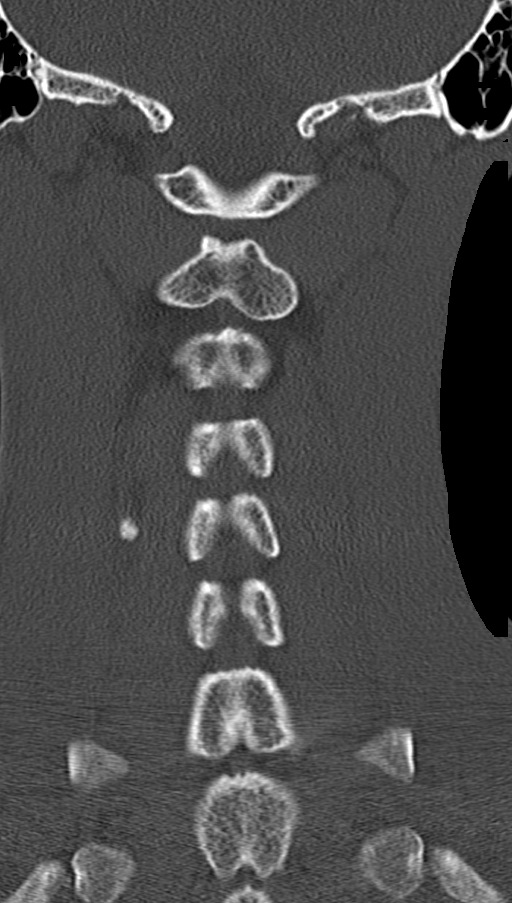

[Series 6: orthogonal bone · axial · 0.23mm/px · z∈[-290,-169]mm · 4 of 101 slices shown, 5 images]
[im 17/101  soft-tissue]
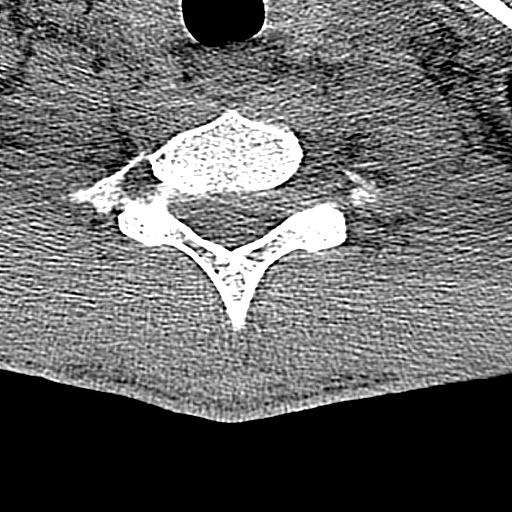
[im 17/101  bone]
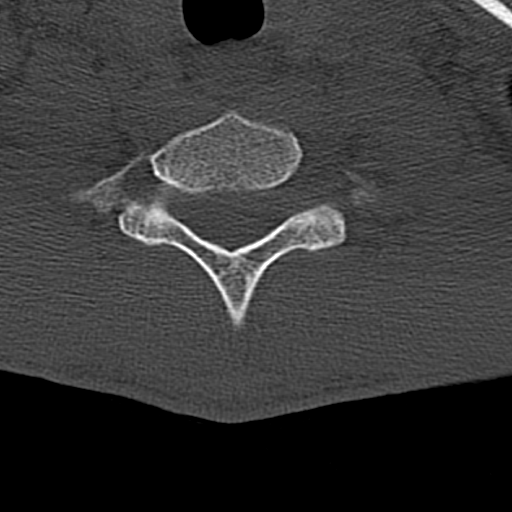
[im 34/101  bone]
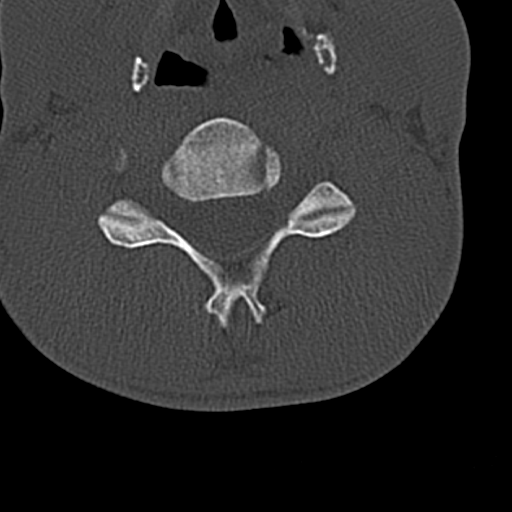
[im 67/101  bone]
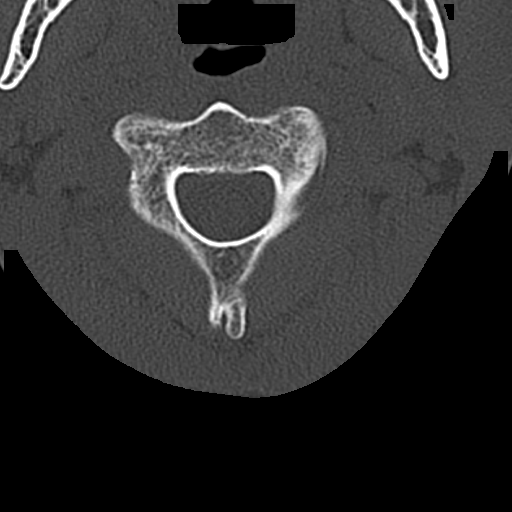
[im 84/101  bone]
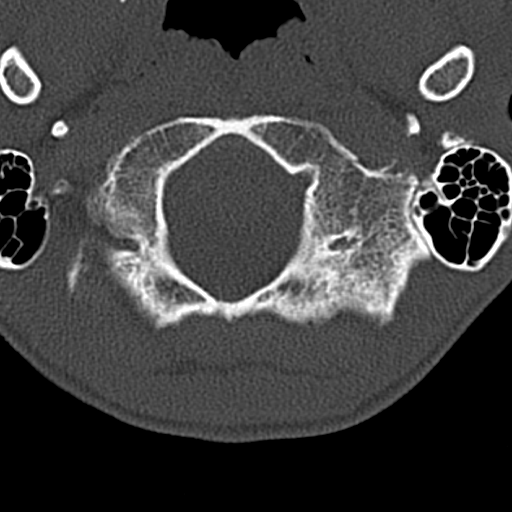

[12 of 33 positions shown; findings below may reference images not displayed]

FINDINGS: CT HEAD FINDINGS

Brain: No evidence of acute infarction, hemorrhage, hydrocephalus,
extra-axial collection or mass lesion/mass effect.

Vascular: No hyperdense vessel or unexpected calcification.

Skull: No osseous abnormality.

Sinuses/Orbits: Visualized paranasal sinuses are clear. Visualized
mastoid sinuses are clear. Visualized orbits demonstrate no focal
abnormality.

Other: None

CT CERVICAL SPINE FINDINGS

Alignment: No static listhesis. Loss of the normal cervical lordosis
with straightening.

Skull base and vertebrae: No acute fracture. No primary bone lesion
or focal pathologic process.

Soft tissues and spinal canal: No prevertebral fluid or swelling. No
visible canal hematoma.

Disc levels: Disc spaces are maintained. No foraminal or central
canal stenosis.

Upper chest: Lung apices are clear.

Other: No fluid collection or hematoma.
IMPRESSION: 1. No acute intracranial pathology.
2.  No acute osseous injury of the cervical spine.

## 2020-06-15 ENCOUNTER — Telehealth: Payer: Self-pay | Admitting: Orthopedic Surgery

## 2020-06-15 NOTE — Telephone Encounter (Signed)
Patient called and is wanting to go back to work full duty he states he is doing heavy lifting around the house and he has all his movement back and he isn't in any pain   Please call him back [phone]

## 2020-06-16 ENCOUNTER — Encounter: Payer: Self-pay | Admitting: Orthopedic Surgery

## 2020-06-16 NOTE — Telephone Encounter (Signed)
I called back to patient; notified work note is ready per Dr Dallas Schimke' response, approved to return to work without restrictions, with return date per patient request of Monday, 06/19/20.

## 2020-06-28 ENCOUNTER — Encounter: Payer: Self-pay | Admitting: Orthopedic Surgery

## 2021-01-24 ENCOUNTER — Encounter: Payer: Self-pay | Admitting: Emergency Medicine

## 2021-01-24 ENCOUNTER — Emergency Department
Admission: EM | Admit: 2021-01-24 | Discharge: 2021-01-24 | Disposition: A | Discharge status: Home / Self Care | Payer: Medicaid Other | Attending: Emergency Medicine | Admitting: Emergency Medicine

## 2021-01-24 ENCOUNTER — Other Ambulatory Visit: Payer: Self-pay

## 2021-01-24 DIAGNOSIS — R059 Cough, unspecified: Secondary | ICD-10-CM | POA: Insufficient documentation

## 2021-01-24 DIAGNOSIS — J029 Acute pharyngitis, unspecified: Secondary | ICD-10-CM | POA: Insufficient documentation

## 2021-01-24 DIAGNOSIS — Z5321 Procedure and treatment not carried out due to patient leaving prior to being seen by health care provider: Secondary | ICD-10-CM | POA: Insufficient documentation

## 2021-01-24 NOTE — ED Notes (Signed)
Pt called x3 to be roomed.  No answer by patient.  

## 2021-01-24 NOTE — ED Triage Notes (Signed)
Pt comes into the ED via POV c/o cough and sore throat he has had a couple weeks.  Pts voice is normal and he currently has even and unlabored respirations.  Pt denies any known fevers.  Pt in NAD.

## 2021-01-26 ENCOUNTER — Emergency Department (HOSPITAL_COMMUNITY)
Admission: EM | Admit: 2021-01-26 | Discharge: 2021-01-26 | Discharge status: Against Medical Advice | Payer: Self-pay | Attending: Emergency Medicine | Admitting: Emergency Medicine

## 2021-01-26 ENCOUNTER — Encounter (HOSPITAL_COMMUNITY): Payer: Self-pay | Admitting: *Deleted

## 2021-01-26 ENCOUNTER — Emergency Department (HOSPITAL_COMMUNITY): Payer: Self-pay

## 2021-01-26 DIAGNOSIS — J069 Acute upper respiratory infection, unspecified: Secondary | ICD-10-CM | POA: Insufficient documentation

## 2021-01-26 DIAGNOSIS — Z5321 Procedure and treatment not carried out due to patient leaving prior to being seen by health care provider: Secondary | ICD-10-CM | POA: Insufficient documentation

## 2021-01-26 DIAGNOSIS — Z20822 Contact with and (suspected) exposure to covid-19: Secondary | ICD-10-CM | POA: Insufficient documentation

## 2021-01-26 DIAGNOSIS — R0602 Shortness of breath: Secondary | ICD-10-CM | POA: Insufficient documentation

## 2021-01-26 LAB — RESP PANEL BY RT-PCR (FLU A&B, COVID) ARPGX2
Influenza A by PCR: NEGATIVE
Influenza B by PCR: NEGATIVE
SARS Coronavirus 2 by RT PCR: NEGATIVE

## 2021-01-26 MED ORDER — ALBUTEROL SULFATE HFA 108 (90 BASE) MCG/ACT IN AERS: 1.0000 | INHALATION_SPRAY | Freq: Four times a day (QID) | RESPIRATORY_TRACT | 0 refills | Status: AC | PRN

## 2021-01-26 MED ORDER — DOXYCYCLINE HYCLATE 100 MG PO CAPS: 100.0000 mg | ORAL_CAPSULE | Freq: Two times a day (BID) | ORAL | 0 refills | Status: AC

## 2021-01-26 MED ORDER — IPRATROPIUM-ALBUTEROL 0.5-2.5 (3) MG/3ML IN SOLN
3.0000 mL | Freq: Once | RESPIRATORY_TRACT | Status: DC
  Filled 2021-01-26: qty 3

## 2021-01-26 NOTE — ED Triage Notes (Signed)
Productive cough with some blood in sputum for over a month

## 2021-01-26 NOTE — ED Notes (Signed)
Pt to the nurses station complaining about wait times. States his mom is a Engineer, civil (consulting) and it should not take this long. Pt went back to room for approximately 1 minute before walking out of the room, pt stated "i'm leaving" and walked out of the ED. Will dispo as AMA.

## 2021-01-26 NOTE — ED Provider Notes (Signed)
[facility] EMERGENCY DEPARTMENT Provider Note   CSN: [id number] Arrival date & time: 01/26/21  1237     History Chief Complaint  Patient presents with   Cough   Hemoptysis    [name] is a 36 y.o. male presents to the ED for evaluation of cough, nasal congestion, sore throat, shortness of breath for the past month.  Patient reports his cough is productive with some blood-streaked sputum occasionally.  Still able to eat and drink.  Denies any weakness, chest pain, weight loss, fever, abdominal pain, nausea, or vomiting.  Denies any syncope.  No medications trialed.  Denies any medical or surgical history.  Denies any daily medications.  No known drug allergies.  2 pack/day smoker for the past 15 years, but now vapes.  Denies EtOH or illicit drug use.   Cough Associated symptoms: shortness of breath and sore throat   Associated symptoms: no chest pain, no fever and no rhinorrhea       Home Medications Prior to Admission medications   Medication Sig Start Date End Date Taking? Authorizing Provider  albuterol (VENTOLIN HFA) 108 (90 Base) MCG/ACT inhaler Inhale 1-2 puffs into the lungs every 6 (six) hours as needed for wheezing or shortness of breath. 01/26/21  Yes Achille Rich, PA-C  doxycycline (VIBRAMYCIN) 100 MG capsule Take 1 capsule (100 mg total) by mouth 2 (two) times daily. 01/26/21  Yes Achille Rich, PA-C  albuterol (VENTOLIN HFA) 108 (90 Base) MCG/ACT inhaler Inhale 2 puffs into the lungs every 6 (six) hours as needed for wheezing or shortness of breath. Patient not taking: No sig reported 03/17/19   Pia Mau M, PA-C  cyclobenzaprine (FLEXERIL) 10 MG tablet Take 0.5 tablets (5 mg total) by mouth 3 (three) times daily as needed for muscle spasms. Patient not taking: Reported on 05/31/2020 04/27/20   Oliver Barre, MD  cyclobenzaprine (FLEXERIL) 10 MG tablet Take 1 tablet (10 mg total) by mouth 2 (two) times daily as needed for muscle spasms. 05/31/20   Oliver Barre, MD  HYDROcodone-acetaminophen (NORCO) 10-325 MG tablet Take 1 tablet by mouth every 4 (four) hours as needed. Patient not taking: Reported on 05/31/2020 05/05/20   Vickki Hearing, MD  ibuprofen (ADVIL) 800 MG tablet Take 1 tablet (800 mg total) by mouth every 8 (eight) hours as needed. Patient not taking: Reported on 05/31/2020 05/05/20   Vickki Hearing, MD  oxyCODONE (ROXICODONE) 5 MG immediate release tablet Take 1 tablet (5 mg total) by mouth every 6 (six) hours as needed for severe pain. Patient not taking: Reported on 05/31/2020 04/27/20   Oliver Barre, MD      Allergies    Patient has no known allergies.    Review of Systems   Review of Systems  Constitutional:  Negative for fever and unexpected weight change.  HENT:  Positive for congestion and sore throat. Negative for rhinorrhea.   Respiratory:  Positive for cough and shortness of breath.   Cardiovascular:  Negative for chest pain and palpitations.   Physical Exam Updated Vital Signs BP (!) 138/100 (BP Location: Right Arm)    Pulse 81    Temp 97.8 F (36.6 C) (Oral)    Resp 20    SpO2 97%  Physical Exam Vitals and nursing note reviewed.  Constitutional:      Appearance: Normal appearance.  HENT:     Head: Normocephalic and atraumatic.     Nose:     Comments: Bilateral erythema and edema  to nasal turbinates with scant clear nasal discharge.    Mouth/Throat:     Mouth: Mucous membranes are moist.     Pharynx: No oropharyngeal exudate or posterior oropharyngeal erythema.  Eyes:     General: No scleral icterus. Cardiovascular:     Rate and Rhythm: Normal rate and regular rhythm.  Pulmonary:     Effort: Pulmonary effort is normal. No respiratory distress.     Breath sounds: Wheezing present.     Comments: Coarse wheezing in bilateral lower lobes.  Satting 97% on room air.  No respiratory distress, accessory muscle use, tripoding, nasal flaring, or cyanosis present.  Patient satting 97% on room air and speaking in  full sentences with ease. Musculoskeletal:        General: No deformity.     Cervical back: Normal range of motion.  Skin:    General: Skin is warm and dry.  Neurological:     General: No focal deficit present.     Mental Status: He is alert. Mental status is at baseline.    ED Results / Procedures / Treatments   Labs (all labs ordered are listed, but only abnormal results are displayed) Labs Reviewed  RESP PANEL BY RT-PCR (FLU A&B, COVID) [id number]   EKG None  Radiology DG Chest 2 View  Result Date: 01/26/2021 CLINICAL DATA:  Hemoptysis. EXAM: CHEST - 2 VIEW COMPARISON:  April 25, 2020. FINDINGS: The heart size and mediastinal contours are within normal limits. Both lungs are clear. The visualized skeletal structures are unremarkable. IMPRESSION: No active cardiopulmonary disease. Electronically Signed   By: Lupita Raider M.D.   On: 01/26/2021 14:01    Procedures Procedures  Mildly elevated blood pressure, however hemodynamically stable  Medications Ordered in ED Medications - No data to display  ED Course/ Medical Decision Making/ A&P                           Medical Decision Making  36 year old male presents emergency department for evaluation of cough and cold symptoms for the past month.  Vital signs are afebrile, normotensive, satting 97% on room air with no increased work of breathing.  Patient is mildly hypertensive at 138/100.  Physical exam shows some wheezing bilaterally expiratory along with some nasal congestion with rhinorrhea.  Chest x-ray shows no active cardiopulmonary process.  Will order DuoNeb for improvement in patient's wheezing.  Per the nurse, the patient refused a DuoNeb and reported that his mom is a Engineer, civil (consulting) and it should wait this long for breathing treatment.  He returned to his room and eloped shortly after.  This patient most likely has some form of upper respiratory infection.  I sent him in doxycycline and albuterol to his pharmacy listed on file.   I attempted to reach out to him and his wife on MyChart.  No voicemails were set up for the patient and his wife's voicemail did not list her name so did not feel comfortable leaving a message.  Return precautions were not able to be discussed with the patient eloped prior to discharge.  Final Clinical Impression(s) / ED Diagnoses Final diagnoses:  Upper respiratory tract infection, unspecified type  Eloped from emergency department    Rx / DC Orders ED Discharge Orders          Ordered    doxycycline (VIBRAMYCIN) 100 MG capsule  2 times daily        01/26/21 1953    albuterol (  VENTOLIN HFA) 108 (90 Base) MCG/ACT inhaler  Every 6 hours PRN        01/26/21 1954              Achille RichRansom, Tikisha Molinaro, Cordelia Poche-C 01/27/21 0102    Eber HongMiller, Brian, MD 01/27/21 2016

## 2021-01-26 NOTE — ED Notes (Signed)
Not in waiting area when called

## 2021-12-14 ENCOUNTER — Emergency Department
Admission: EM | Admit: 2021-12-14 | Discharge: 2021-12-14 | Disposition: A | Discharge status: Home / Self Care | Payer: Medicaid Other | Attending: Emergency Medicine | Admitting: Emergency Medicine

## 2021-12-14 ENCOUNTER — Emergency Department: Payer: Medicaid Other

## 2021-12-14 ENCOUNTER — Other Ambulatory Visit: Payer: Self-pay

## 2021-12-14 DIAGNOSIS — S8990XA Unspecified injury of unspecified lower leg, initial encounter: Secondary | ICD-10-CM

## 2021-12-14 DIAGNOSIS — M25561 Pain in right knee: Secondary | ICD-10-CM | POA: Diagnosis not present

## 2021-12-14 MED ORDER — MELOXICAM 15 MG PO TABS: 15.0000 mg | ORAL_TABLET | Freq: Every day | ORAL | 0 refills | Status: AC

## 2021-12-14 MED ORDER — CYCLOBENZAPRINE HCL 10 MG PO TABS: 10.0000 mg | ORAL_TABLET | Freq: Three times a day (TID) | ORAL | 0 refills | Status: AC | PRN
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
## 2022-03-14 ENCOUNTER — Encounter: Payer: Self-pay | Admitting: Radiology

## 2023-08-12 ENCOUNTER — Emergency Department (HOSPITAL_COMMUNITY)
Admission: EM | Admit: 2023-08-12 | Discharge: 2023-08-12 | Disposition: A | Discharge status: Home / Self Care | Source: Ambulatory Visit | Attending: Emergency Medicine | Admitting: Emergency Medicine

## 2023-08-12 ENCOUNTER — Encounter (HOSPITAL_COMMUNITY): Payer: Self-pay | Admitting: Emergency Medicine

## 2023-08-12 ENCOUNTER — Emergency Department (HOSPITAL_COMMUNITY)

## 2023-08-12 DIAGNOSIS — W260XXA Contact with knife, initial encounter: Secondary | ICD-10-CM | POA: Insufficient documentation

## 2023-08-12 DIAGNOSIS — S51811A Laceration without foreign body of right forearm, initial encounter: Secondary | ICD-10-CM | POA: Diagnosis present

## 2023-08-12 DIAGNOSIS — Y990 Civilian activity done for income or pay: Secondary | ICD-10-CM | POA: Diagnosis not present

## 2023-08-12 MED ORDER — CEPHALEXIN 500 MG PO CAPS: 500.0000 mg | ORAL_CAPSULE | Freq: Four times a day (QID) | ORAL | 0 refills | Status: DC

## 2023-08-12 MED ORDER — CEPHALEXIN 500 MG PO CAPS: 500.0000 mg | ORAL_CAPSULE | Freq: Four times a day (QID) | ORAL | 0 refills | Status: AC

## 2023-08-12 MED ORDER — LIDOCAINE-EPINEPHRINE (PF) 2 %-1:200000 IJ SOLN
20.0000 mL | Freq: Once | INTRAMUSCULAR | Status: AC
  Administered 2023-08-12: 20 mL
  Filled 2023-08-12: qty 20

## 2023-08-12 MED ORDER — TETANUS-DIPHTH-ACELL PERTUSSIS 5-2.5-18.5 LF-MCG/0.5 IM SUSY: 0.5000 mL | PREFILLED_SYRINGE | Freq: Once | INTRAMUSCULAR | Status: DC
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
## 2023-12-19 NOTE — Progress Notes (Signed)
 [facility] encounter Follow up Patient E&M Service - Outpatient    The patient reports they are physically located in [place]  and is currently: not at home. I conducted a audio/video visit. I spent 27 minutes on the video call with the patient. I spent an additional 2 minutes on pre- and post-visit activities on the date of service .     Name: [name] Date: 12/19/2023 MRN: [id number] DOB: 1985-11-28 PCP: [name], [name]   Identifying Information: [name] is a 38 y.o. with a history of opioid use disorder. He is married and he has 2 young daughters and he works full time as a psychologist, occupational.   Assessment:        [name] is a 38 y.o. with a history of opioid use disorder and remote history of alcohol use disorder. He was started on suboxone while in [facility] jail but he did not have FU upon his release. He found an online provider for the suboxone but was paying a lot of money  until his friend referred him to us  in April 2023.         We have been seeing him in the [facility] clinic since then. He has been stable on doses from 24-32 mg. He has never been in group therapy but he did have a private individual counselor whom he was seeing for some time. He left swabs with her for awhile due to  he has financial challenges. He has been on medicaid but he thinks that he needs a higher dose so he has been paying out of pocket.  He  has financial challenges and he is recovering from bankruptcy as well.         He is married and his relationship with his wife and family is much better now that he is in recovery.  He has had some ongoing stress with work and finances that continue.  He is currently using pastoral counseling at his church. He also requires 32 mg dosing and he has to use [facility].  We will continue with him monthly. There is no local provider who will see him and rx over 24 mg so we will continue to treat him in the [facility] clinic.    Date of Intake to STAR clinic: 05/11/2021 Narcan: needs  ID Screening reviewed on: March 2023  Tested in [facility] negative  Hep C: Hep B: Hep A: HIV:  Birth Control: NA  Risk Assessment: A suicide and violence risk assessment was performed as part of this evaluation. There patient is deemed to be at chronic elevated risk for self-harm/suicide given the following factors: male age 36-35 and past substance abuse. The patient is deemed to be at chronic elevated risk for violence given the following factors: male gender. These risk factors are mitigated by the following factors:lack of active SI/HI, no know access to weapons or firearms, no history of previous suicide attempts , no history of violence, motivation for treatment, utilization of positive coping skills, supportive family, sense of responsibility to family and social supports, minor children living at home, presence of a significant relationship, presence of an available support system, employment or functioning in a structured work/academic setting, enjoyment of leisure actvities, expresses purpose for living, religious or spiritual prohibition to suicide/violence, current treatment compliance, effective problem solving skills, safe housing, support system in agreement with treatment recommendations, and presence of a safety plan with follow-up care. There is no acute  risk for suicide or violence at this time. The patient was educated about relevant modifiable risk factors including following recommendations for treatment of psychiatric illness and abstaining from substance abuse.  While future psychiatric events cannot be accurately predicted, the patient does not currently require  acute inpatient psychiatric care and does not currently meet [place]  involuntary commitment criteria.         Stressors: financial stressors (bankruptcy to pay off), work stress, sick older relatives     Diagnosis ICD-10-CM Associated Orders  1.  Opioid use disorder, severe, dependence    (CMS-HCC)  F11.20     2. History of alcohol use disorder  Z87.898     3. Vaping nicotine dependence, tobacco product  F17.290         Plan: 1. Medications - Continue suboxone 8-2 mg  QID ( 32 mg )- he uses [facility] as medicaid will not cover 32 mg without PA - Continue Nicorette gum 6mg  QID- did not like  - Pt reviewed on [place] PDMP at today's visit  2. Non Pharmacological Intervention - Pt is speaking with church counselors  Alan Brier in [place] is his counselor  [facility]  [address] [place], [place] [address]  [phone] - pt sent treatment agreement via MyChart UDS- pt leaves swabs with counselor for no charge as he is uninsured 05/18/21- scanned under media- + bup and + norbup 09/28/21 -scanned under media- + bup and + norbup, appropriate  05/18/21- scanned under media- + bup and + norbup 03/29/22 scanned under media- + bup  06/07/22 scanned under media + bup -from [facility], + bup swab 08/02/22 scanned under media + bup -from [facility], + bup, swab 09/22/22 scanned under media + bup -from [facility], + bup, swab 12/06/2022  scanned under media + bup -from [facility], + bup, swab 01/17/2023 scanned under media + bup -from [facility], + bup, swab 04/01/23  scanned under media + bup -from [facility], + bup, swab 05/30/23 scanned under media + bup -from [facility], + bup, swab 07/14/23 scanned under media + bup -from [facility], + bup, swab 08/13/23 he went to [facility] and took pics. My care manger is working to figure out  10/03/23 + bup + Norbup  12/05/23  + bup + Norbup  - pt could go to [facility] for PCP but they will not go RX over 24 mg   3. Follow Up :  02/13/23 at 8:30 am video  Refill 01/16/23  Psychotherapy: No billable psychotherapy service provided.  Tobacco Cessation Counseling: I assessed [name] to be in a contemplative stage with respect to tobacco use. His goal is to quit in 2024.    Subjective:   Psychiatric Chief Concern:  Follow-up psychiatric evaluation for opioid use disorder.   Interval History:  History of Present Illness [name] is a 38 year old presenting for ongoing suboxone manamgement with work-related anxiety and psychosocial stressors.  He reports significant anxiety related to his current job, describing that his anxiety begins as soon as he pulls into the parking lot and that his nerves become shaky in anticipation of what might happen during the workday. He states that the work environment causes stress and discomfort, which contributes to ongoing anxiety. He expresses a desire to leave his current job and anticipates that doing so will help him feel less stressed and anxious.  Ongoing psychosocial stressors, including family dynamics and concerns about his grandmother's progressive dementia, contribute  to family tension. He reports that his relationship with his wife remains strong and that he looks forward to spending time at home with his family, which he finds comforting even when the household is chaotic.  He looks forward to a break during the holidays and anticipates that spending time with his family will be beneficial. 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  No drug use and no cravings. He is still vaping and it is not ready to quit due to the stress of his current work.   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 He lives with his spouse and 2 children (ages 61 and 27). He will take time off work for the holidays to spend time with family. He enjoys preparing holiday meals and puts up a Christmas tree early each year. He maintains a friendship with the owner of the [employer].    Medical Hospitalizations in past year None    Social History: reviewed; pertinents have been documented in the interval history section.  ROS: As per Interval History and: Constitutional:  no significant appetite change; no fatigue; system negative except: Neuro:  no headaches, no tremor; system negative except:    Objective:  Mental Status Exam: APPEARANCE: appropriately groomed, casually dressed, and appears stated age.  BEHAVIOR: Appropriate eye contact, facial expressions, and posture. No psychomotor activation/retardation  COGNITION: alert, able to attend to conversation  FUND OF KNOWLEDGE: average for age/education on gross exam, no formal testing done  ATTITUDE: calm, cooperative, and communicative.  SPEECH: normal rate, rhythm, volume.  LANGUAGE: fluent English, with no gross signs of dysarthria  MOOD: good  AFFECT: calm, cooperative,  THOUGHT PROCESS: Coherent, linear, and goal-oriented. No derailment, flight of ideas, or perseverance.  THOUGHT CONTENT: No suicidal/assaultive thoughts, plans, or intentions. No apparent delusions, ideas of reference, phobias, or preoccupations.  PERCEPTIONS: no overt hallucinations or illusions  INSIGHT: intact  JUDGEMENT: intact    Medications: reviewed at today's visit  Vitals: There were no vitals taken for this visit.  PE:  Vital signs were reviewed.   Gen: NAD Pulm: no abnormal work of breathing MSK: no abnormal movements, no tics/tremors observed  Psychometrics:unable to do     [name], Sheldon, [name]  12/19/2023
